# Patient Record
Sex: Female | Born: 1955
Health system: Southern US, Community
[De-identification: ages and names within clinical notes are randomized; demographics above are authoritative.]

## PROBLEM LIST (undated history)

## (undated) ENCOUNTER — Ambulatory Visit

## (undated) DIAGNOSIS — G5601 Carpal tunnel syndrome, right upper limb: Secondary | ICD-10-CM

## (undated) DIAGNOSIS — M1990 Unspecified osteoarthritis, unspecified site: Secondary | ICD-10-CM

## (undated) DIAGNOSIS — M65311 Trigger thumb, right thumb: Secondary | ICD-10-CM

## (undated) DIAGNOSIS — E785 Hyperlipidemia, unspecified: Secondary | ICD-10-CM

## (undated) DIAGNOSIS — Z9889 Other specified postprocedural states: Secondary | ICD-10-CM

## (undated) DIAGNOSIS — R112 Nausea with vomiting, unspecified: Secondary | ICD-10-CM

## (undated) HISTORY — DX: Unspecified osteoarthritis, unspecified site: M19.90

## (undated) HISTORY — PX: MENISCUS REPAIR: SHX5179

## (undated) HISTORY — PX: JOINT REPLACEMENT: SHX530

## (undated) HISTORY — DX: Hyperlipidemia, unspecified: E78.5

## (undated) HISTORY — PX: TUBAL LIGATION: SHX77

---

## 1999-09-26 ENCOUNTER — Encounter: Admission: RE | Admit: 1999-09-26 | Discharge: 1999-09-26 | Payer: Self-pay | Admitting: Internal Medicine

## 1999-09-26 ENCOUNTER — Encounter: Payer: Self-pay | Admitting: Internal Medicine

## 2001-11-18 ENCOUNTER — Encounter: Payer: Self-pay | Admitting: Internal Medicine

## 2001-11-18 ENCOUNTER — Encounter: Admission: RE | Admit: 2001-11-18 | Discharge: 2001-11-18 | Payer: Self-pay | Admitting: Internal Medicine

## 2003-03-30 ENCOUNTER — Encounter: Admission: RE | Admit: 2003-03-30 | Discharge: 2003-03-30 | Payer: Self-pay | Admitting: Internal Medicine

## 2003-03-30 ENCOUNTER — Encounter: Payer: Self-pay | Admitting: Internal Medicine

## 2003-04-24 ENCOUNTER — Emergency Department (HOSPITAL_COMMUNITY): Admission: EM | Admit: 2003-04-24 | Discharge: 2003-04-24 | Payer: Self-pay | Admitting: Emergency Medicine

## 2004-05-07 ENCOUNTER — Encounter: Admission: RE | Admit: 2004-05-07 | Discharge: 2004-05-07 | Payer: Self-pay | Admitting: Internal Medicine

## 2005-02-11 ENCOUNTER — Other Ambulatory Visit: Admission: RE | Admit: 2005-02-11 | Discharge: 2005-02-11 | Payer: Self-pay | Admitting: Internal Medicine

## 2005-06-04 ENCOUNTER — Encounter: Admission: RE | Admit: 2005-06-04 | Discharge: 2005-06-04 | Payer: Self-pay | Admitting: Internal Medicine

## 2006-06-09 ENCOUNTER — Encounter: Admission: RE | Admit: 2006-06-09 | Discharge: 2006-06-09 | Payer: Self-pay | Admitting: Internal Medicine

## 2007-02-23 ENCOUNTER — Other Ambulatory Visit: Admission: RE | Admit: 2007-02-23 | Discharge: 2007-02-23 | Payer: Self-pay | Admitting: Internal Medicine

## 2007-06-13 ENCOUNTER — Encounter: Admission: RE | Admit: 2007-06-13 | Discharge: 2007-06-13 | Payer: Self-pay | Admitting: Internal Medicine

## 2008-04-12 ENCOUNTER — Encounter: Admission: RE | Admit: 2008-04-12 | Discharge: 2008-04-12 | Payer: Self-pay | Admitting: Family Medicine

## 2008-07-18 ENCOUNTER — Encounter: Admission: RE | Admit: 2008-07-18 | Discharge: 2008-07-18 | Payer: Self-pay | Admitting: Internal Medicine

## 2009-03-06 ENCOUNTER — Other Ambulatory Visit: Admission: RE | Admit: 2009-03-06 | Discharge: 2009-03-06 | Payer: Self-pay | Admitting: Internal Medicine

## 2009-08-12 ENCOUNTER — Encounter: Admission: RE | Admit: 2009-08-12 | Discharge: 2009-08-12 | Payer: Self-pay | Admitting: Internal Medicine

## 2010-08-15 ENCOUNTER — Encounter
Admission: RE | Admit: 2010-08-15 | Discharge: 2010-08-15 | Payer: Self-pay | Source: Home / Self Care | Attending: Internal Medicine | Admitting: Internal Medicine

## 2011-03-26 ENCOUNTER — Other Ambulatory Visit (HOSPITAL_COMMUNITY)
Admission: RE | Admit: 2011-03-26 | Discharge: 2011-03-26 | Disposition: A | Discharge status: Home / Self Care | Payer: BC Managed Care – PPO | Source: Ambulatory Visit | Attending: Internal Medicine | Admitting: Internal Medicine

## 2011-03-26 ENCOUNTER — Other Ambulatory Visit: Payer: Self-pay | Admitting: Internal Medicine

## 2011-03-26 DIAGNOSIS — Z01419 Encounter for gynecological examination (general) (routine) without abnormal findings: Secondary | ICD-10-CM | POA: Insufficient documentation

## 2011-08-18 ENCOUNTER — Other Ambulatory Visit: Payer: Self-pay | Admitting: Internal Medicine

## 2011-08-18 DIAGNOSIS — Z1231 Encounter for screening mammogram for malignant neoplasm of breast: Secondary | ICD-10-CM

## 2011-09-10 ENCOUNTER — Ambulatory Visit
Admission: RE | Admit: 2011-09-10 | Discharge: 2011-09-10 | Disposition: A | Discharge status: Home / Self Care | Payer: BC Managed Care – PPO | Source: Ambulatory Visit | Attending: Internal Medicine | Admitting: Internal Medicine

## 2011-09-10 DIAGNOSIS — Z1231 Encounter for screening mammogram for malignant neoplasm of breast: Secondary | ICD-10-CM

## 2012-02-24 ENCOUNTER — Other Ambulatory Visit: Payer: Self-pay | Admitting: Dermatology

## 2012-03-03 ENCOUNTER — Other Ambulatory Visit: Payer: Self-pay | Admitting: Orthopedic Surgery

## 2012-03-11 ENCOUNTER — Encounter (HOSPITAL_BASED_OUTPATIENT_CLINIC_OR_DEPARTMENT_OTHER): Admission: RE | Payer: Self-pay | Source: Ambulatory Visit

## 2012-03-11 ENCOUNTER — Ambulatory Visit (HOSPITAL_BASED_OUTPATIENT_CLINIC_OR_DEPARTMENT_OTHER)
Admission: RE | Admit: 2012-03-11 | Payer: BC Managed Care – PPO | Source: Ambulatory Visit | Admitting: Orthopedic Surgery

## 2012-03-11 SURGERY — EXCISION MASS
Anesthesia: LOCAL | Laterality: Right

## 2012-09-14 ENCOUNTER — Other Ambulatory Visit: Payer: Self-pay | Admitting: Internal Medicine

## 2012-09-14 DIAGNOSIS — Z1231 Encounter for screening mammogram for malignant neoplasm of breast: Secondary | ICD-10-CM

## 2012-10-11 ENCOUNTER — Ambulatory Visit
Admission: RE | Admit: 2012-10-11 | Discharge: 2012-10-11 | Disposition: A | Discharge status: Home / Self Care | Payer: BC Managed Care – PPO | Source: Ambulatory Visit | Attending: Internal Medicine | Admitting: Internal Medicine

## 2012-10-11 DIAGNOSIS — Z1231 Encounter for screening mammogram for malignant neoplasm of breast: Secondary | ICD-10-CM

## 2012-10-21 ENCOUNTER — Other Ambulatory Visit (HOSPITAL_COMMUNITY): Payer: Self-pay | Admitting: Orthopaedic Surgery

## 2012-10-27 NOTE — Patient Instructions (Addendum)
Jordan Wade  3/Jordan/2016   Your procedure is scheduled on:11/04/12  FRIDAY    Report to [facility] at  1030     AM.  Call this number if you have problems the morning of surgery: [phone]       Remember:   Do not eat foodAfter Midnight. Thursday NIGHT,  MAY HAVE CLEAR LIQUIDS Friday MORNING UNTIL 0600 AM, THEN NOTHING BY MOUTH   Take these medicines the morning of surgery with A SIP OF WATER:  NONE   .  Contacts, dentures or partial plates can not be worn to surgery  Leave suitcase in the car. After surgery it may be brought to your room.  For patients admitted to the hospital, checkout time is 11:00 AM day of  discharge.             SPECIAL INSTRUCTIONS- SEE [facility] PREPARING FOR SURGERY INSTRUCTION SHEET-     DO NOT WEAR JEWELRY, LOTIONS, POWDERS, OR PERFUMES.  WOMEN-- DO NOT SHAVE LEGS OR UNDERARMS FOR 12 HOURS BEFORE SHOWERS. MEN MAY SHAVE FACE.  Patients discharged the day of surgery will not be allowed to drive home. IF going home the day of surgery, you must have a driver and someone to stay with you for the first 24 hours  Name and phone number of your driver:     admission                                                                   Please read over the following fact sheets that you were given: MRSA Information, Incentive Spirometry Sheet, Blood Transfusion Sheet  Information                                                                                   Jordan Wade  PST 336  [id number]                 FAILURE TO FOLLOW THESE INSTRUCTIONS MAY RESULT IN  CANCELLATION   OF YOUR SURGERY                                                  Patient Signature _____________________________

## 2012-10-28 ENCOUNTER — Encounter (HOSPITAL_COMMUNITY): Payer: Self-pay

## 2012-10-28 ENCOUNTER — Encounter (HOSPITAL_COMMUNITY)
Admission: RE | Admit: 2012-10-28 | Discharge: 2012-10-28 | Disposition: A | Discharge status: Home / Self Care | Payer: BC Managed Care – PPO | Source: Ambulatory Visit | Attending: Orthopaedic Surgery | Admitting: Orthopaedic Surgery

## 2012-10-28 ENCOUNTER — Encounter (HOSPITAL_COMMUNITY): Payer: Self-pay | Admitting: Pharmacy Technician

## 2012-10-28 DIAGNOSIS — Z01812 Encounter for preprocedural laboratory examination: Secondary | ICD-10-CM | POA: Insufficient documentation

## 2012-10-28 HISTORY — DX: Unspecified osteoarthritis, unspecified site: M19.90

## 2012-10-28 HISTORY — DX: Other specified postprocedural states: R11.2

## 2012-10-28 HISTORY — DX: Other specified postprocedural states: Z98.890

## 2012-10-28 LAB — PROTIME-INR
INR: 0.96 (ref 0.00–1.49)
Prothrombin Time: 12.7 seconds (ref 11.6–15.2)

## 2012-10-28 LAB — URINALYSIS, ROUTINE W REFLEX MICROSCOPIC
Bilirubin Urine: NEGATIVE
Protein, ur: NEGATIVE mg/dL

## 2012-10-28 LAB — APTT: aPTT: 32 seconds (ref 24–37)

## 2012-10-28 LAB — SURGICAL PCR SCREEN
MRSA, PCR: NEGATIVE
Staphylococcus aureus: POSITIVE — AB

## 2012-10-28 LAB — CBC
HCT: 41.9 % (ref 36.0–46.0)
Hemoglobin: 14.2 g/dL (ref 12.0–15.0)
MCH: 31.1 pg (ref 26.0–34.0)
MCHC: 33.9 g/dL (ref 30.0–36.0)
Platelets: 264 10*3/uL (ref 150–400)
RBC: 4.57 MIL/uL (ref 3.87–5.11)
RDW: 13 % (ref 11.5–15.5)
WBC: 8.3 10*3/uL (ref 4.0–10.5)

## 2012-10-28 LAB — ABO/RH: ABO/RH(D): A NEG

## 2012-10-28 LAB — BASIC METABOLIC PANEL: Sodium: 140 mEq/L (ref 135–145)

## 2012-10-28 NOTE — Progress Notes (Signed)
Left message on patients cell phone and at home to begin Mupirocin as directed. Asked for call back verification

## 2012-11-04 ENCOUNTER — Encounter (HOSPITAL_COMMUNITY): Payer: Self-pay | Admitting: Certified Registered Nurse Anesthetist

## 2012-11-04 ENCOUNTER — Inpatient Hospital Stay (HOSPITAL_COMMUNITY): Payer: BC Managed Care – PPO

## 2012-11-04 ENCOUNTER — Encounter (HOSPITAL_COMMUNITY): Payer: Self-pay | Admitting: *Deleted

## 2012-11-04 ENCOUNTER — Inpatient Hospital Stay (HOSPITAL_COMMUNITY)
Admission: RE | Admit: 2012-11-04 | Discharge: 2012-11-07 | DRG: 471 | Disposition: A | Discharge status: Home Health | Payer: BC Managed Care – PPO | Source: Ambulatory Visit | Attending: Orthopaedic Surgery | Admitting: Orthopaedic Surgery

## 2012-11-04 ENCOUNTER — Encounter (HOSPITAL_COMMUNITY)
Admission: RE | Disposition: A | Discharge status: Home Health | Payer: Self-pay | Source: Ambulatory Visit | Attending: Orthopaedic Surgery

## 2012-11-04 ENCOUNTER — Inpatient Hospital Stay (HOSPITAL_COMMUNITY): Payer: BC Managed Care – PPO | Admitting: Certified Registered Nurse Anesthetist

## 2012-11-04 DIAGNOSIS — M1610 Unilateral primary osteoarthritis, unspecified hip: Principal | ICD-10-CM | POA: Diagnosis present

## 2012-11-04 DIAGNOSIS — M169 Osteoarthritis of hip, unspecified: Principal | ICD-10-CM

## 2012-11-04 DIAGNOSIS — Z01812 Encounter for preprocedural laboratory examination: Secondary | ICD-10-CM

## 2012-11-04 HISTORY — PX: BILATERAL ANTERIOR TOTAL HIP ARTHROPLASTY: SHX5567

## 2012-11-04 SURGERY — ARTHROPLASTY, HIP, BILATERAL, TOTAL, ANTERIOR APPROACH
Anesthesia: General | Site: Hip | Laterality: Bilateral | Wound class: Clean

## 2012-11-04 MED ORDER — SUFENTANIL CITRATE 50 MCG/ML IV SOLN
INTRAVENOUS | Status: DC | PRN
  Administered 2012-11-04 (×5): 10 ug via INTRAVENOUS

## 2012-11-04 MED ORDER — OXYCODONE HCL 5 MG PO TABS
5.0000 mg | ORAL_TABLET | ORAL | Status: DC | PRN
  Administered 2012-11-05 (×3): 5 mg via ORAL
  Filled 2012-11-04 (×3): qty 1

## 2012-11-04 MED ORDER — METOCLOPRAMIDE HCL 5 MG/ML IJ SOLN: 5.0000 mg | Freq: Three times a day (TID) | INTRAMUSCULAR | Status: DC | PRN

## 2012-11-04 MED ORDER — SODIUM CHLORIDE 0.9 % IV SOLN
INTRAVENOUS | Status: DC
  Administered 2012-11-04 – 2012-11-05 (×2): via INTRAVENOUS

## 2012-11-04 MED ORDER — METHOCARBAMOL 500 MG PO TABS: 500.0000 mg | ORAL_TABLET | Freq: Four times a day (QID) | ORAL | Status: DC | PRN

## 2012-11-04 MED ORDER — RIVAROXABAN 10 MG PO TABS
10.0000 mg | ORAL_TABLET | Freq: Every day | ORAL | Status: DC
  Administered 2012-11-05 – 2012-11-07 (×3): 10 mg via ORAL
  Filled 2012-11-04 (×4): qty 1

## 2012-11-04 MED ORDER — FERROUS SULFATE 325 (65 FE) MG PO TABS
325.0000 mg | ORAL_TABLET | Freq: Three times a day (TID) | ORAL | Status: DC
  Administered 2012-11-05 – 2012-11-07 (×7): 325 mg via ORAL
  Filled 2012-11-04 (×10): qty 1

## 2012-11-04 MED ORDER — HYDROMORPHONE HCL PF 1 MG/ML IJ SOLN
0.2500 mg | INTRAMUSCULAR | Status: DC | PRN
  Administered 2012-11-04: 0.25 mg via INTRAVENOUS

## 2012-11-04 MED ORDER — DIPHENHYDRAMINE HCL 12.5 MG/5ML PO ELIX: 12.5000 mg | ORAL_SOLUTION | ORAL | Status: DC | PRN

## 2012-11-04 MED ORDER — 0.9 % SODIUM CHLORIDE (POUR BTL) OPTIME
TOPICAL | Status: DC | PRN
  Administered 2012-11-04: 1000 mL

## 2012-11-04 MED ORDER — CEFAZOLIN SODIUM 1-5 GM-% IV SOLN
1.0000 g | Freq: Four times a day (QID) | INTRAVENOUS | Status: AC
  Administered 2012-11-04 – 2012-11-05 (×2): 1 g via INTRAVENOUS
  Filled 2012-11-04 (×2): qty 50

## 2012-11-04 MED ORDER — PROPOFOL 10 MG/ML IV BOLUS
INTRAVENOUS | Status: DC | PRN
  Administered 2012-11-04: 130 mg via INTRAVENOUS

## 2012-11-04 MED ORDER — LACTATED RINGERS IV SOLN: INTRAVENOUS | Status: DC

## 2012-11-04 MED ORDER — LACTATED RINGERS IV SOLN
INTRAVENOUS | Status: DC | PRN
  Administered 2012-11-04 (×4): via INTRAVENOUS

## 2012-11-04 MED ORDER — PHENOL 1.4 % MT LIQD: 1.0000 | OROMUCOSAL | Status: DC | PRN

## 2012-11-04 MED ORDER — DOCUSATE SODIUM 100 MG PO CAPS
100.0000 mg | ORAL_CAPSULE | Freq: Two times a day (BID) | ORAL | Status: DC
  Administered 2012-11-04 – 2012-11-07 (×6): 100 mg via ORAL
  Filled 2012-11-04 (×5): qty 1

## 2012-11-04 MED ORDER — MENTHOL 3 MG MT LOZG
1.0000 | LOZENGE | OROMUCOSAL | Status: DC | PRN
  Filled 2012-11-04: qty 9

## 2012-11-04 MED ORDER — ACETAMINOPHEN 650 MG RE SUPP: 650.0000 mg | Freq: Four times a day (QID) | RECTAL | Status: DC | PRN

## 2012-11-04 MED ORDER — ZOLPIDEM TARTRATE 5 MG PO TABS: 5.0000 mg | ORAL_TABLET | Freq: Every evening | ORAL | Status: DC | PRN

## 2012-11-04 MED ORDER — CEFAZOLIN SODIUM-DEXTROSE 2-3 GM-% IV SOLR
2.0000 g | INTRAVENOUS | Status: AC
  Administered 2012-11-04: 2 g via INTRAVENOUS

## 2012-11-04 MED ORDER — ALUM & MAG HYDROXIDE-SIMETH 200-200-20 MG/5ML PO SUSP: 30.0000 mL | ORAL | Status: DC | PRN

## 2012-11-04 MED ORDER — ACETAMINOPHEN 10 MG/ML IV SOLN
INTRAVENOUS | Status: DC | PRN
  Administered 2012-11-04: 1000 mg via INTRAVENOUS

## 2012-11-04 MED ORDER — HYDROMORPHONE HCL PF 1 MG/ML IJ SOLN
INTRAMUSCULAR | Status: DC | PRN
  Administered 2012-11-04 (×2): .4 mg via INTRAVENOUS

## 2012-11-04 MED ORDER — DEXAMETHASONE SODIUM PHOSPHATE 10 MG/ML IJ SOLN
INTRAMUSCULAR | Status: DC | PRN
  Administered 2012-11-04: 5 mg via INTRAVENOUS

## 2012-11-04 MED ORDER — ONDANSETRON HCL 4 MG/2ML IJ SOLN
INTRAMUSCULAR | Status: DC | PRN
  Administered 2012-11-04: 4 mg via INTRAVENOUS

## 2012-11-04 MED ORDER — METHOCARBAMOL 100 MG/ML IJ SOLN
500.0000 mg | Freq: Four times a day (QID) | INTRAVENOUS | Status: DC | PRN
  Filled 2012-11-04: qty 5

## 2012-11-04 MED ORDER — STERILE WATER FOR IRRIGATION IR SOLN
Status: DC | PRN
  Administered 2012-11-04: 1500 mL

## 2012-11-04 MED ORDER — CISATRACURIUM BESYLATE (PF) 10 MG/5ML IV SOLN
INTRAVENOUS | Status: DC | PRN
  Administered 2012-11-04: 4 mg via INTRAVENOUS
  Administered 2012-11-04: 10 mg via INTRAVENOUS
  Administered 2012-11-04: 4 mg via INTRAVENOUS

## 2012-11-04 MED ORDER — OXYCODONE HCL ER 20 MG PO T12A
20.0000 mg | EXTENDED_RELEASE_TABLET | Freq: Two times a day (BID) | ORAL | Status: DC
  Administered 2012-11-04: 10 mg via ORAL
  Administered 2012-11-05: 20 mg via ORAL
  Administered 2012-11-05: 10 mg via ORAL
  Filled 2012-11-04 (×3): qty 1

## 2012-11-04 MED ORDER — HYDROMORPHONE HCL PF 1 MG/ML IJ SOLN
1.0000 mg | INTRAMUSCULAR | Status: DC | PRN
  Administered 2012-11-04: 0.5 mg via INTRAVENOUS
  Filled 2012-11-04: qty 1

## 2012-11-04 MED ORDER — METOCLOPRAMIDE HCL 5 MG PO TABS
5.0000 mg | ORAL_TABLET | Freq: Three times a day (TID) | ORAL | Status: DC | PRN
  Filled 2012-11-04: qty 2

## 2012-11-04 MED ORDER — ONDANSETRON HCL 4 MG/2ML IJ SOLN: 4.0000 mg | Freq: Four times a day (QID) | INTRAMUSCULAR | Status: DC | PRN

## 2012-11-04 MED ORDER — SODIUM CHLORIDE 0.9 % IR SOLN
Status: DC | PRN
  Administered 2012-11-04 (×2): 1000 mL

## 2012-11-04 MED ORDER — GLYCOPYRROLATE 0.2 MG/ML IJ SOLN
INTRAMUSCULAR | Status: DC | PRN
  Administered 2012-11-04: .4 mg via INTRAVENOUS

## 2012-11-04 MED ORDER — NEOSTIGMINE METHYLSULFATE 1 MG/ML IJ SOLN
INTRAMUSCULAR | Status: DC | PRN
  Administered 2012-11-04: 3 mg via INTRAVENOUS

## 2012-11-04 MED ORDER — ACETAMINOPHEN 325 MG PO TABS: 650.0000 mg | ORAL_TABLET | Freq: Four times a day (QID) | ORAL | Status: DC | PRN

## 2012-11-04 MED ORDER — ONDANSETRON HCL 4 MG PO TABS: 4.0000 mg | ORAL_TABLET | Freq: Four times a day (QID) | ORAL | Status: DC | PRN

## 2012-11-04 MED ORDER — MIDAZOLAM HCL 5 MG/5ML IJ SOLN
INTRAMUSCULAR | Status: DC | PRN
  Administered 2012-11-04: 2 mg via INTRAVENOUS

## 2012-11-04 SURGICAL SUPPLY — 50 items
ADH SKN CLS APL DERMABOND .7 (GAUZE/BANDAGES/DRESSINGS) ×2
BAG SPEC THK2 15X12 ZIP CLS (MISCELLANEOUS) ×2
BAG ZIPLOCK 12X15 (MISCELLANEOUS) ×4 IMPLANT
BLADE SAW SGTL 13.0X1.19X90.0M (BLADE) ×2 IMPLANT
BLADE SAW SGTL 18X1.27X75 (BLADE) ×2 IMPLANT
CELLS DAT CNTRL 66122 CELL SVR (MISCELLANEOUS) ×2 IMPLANT
CLEANER TIP ELECTROSURG 2X2 (MISCELLANEOUS) ×1 IMPLANT
CLOTH BEACON ORANGE TIMEOUT ST (SAFETY) ×4 IMPLANT
DERMABOND ADVANCED (GAUZE/BANDAGES/DRESSINGS) ×2
DERMABOND ADVANCED .7 DNX12 (GAUZE/BANDAGES/DRESSINGS) IMPLANT
DRAPE C-ARM 42X72 X-RAY (DRAPES) ×4 IMPLANT
DRAPE STERI IOBAN 125X83 (DRAPES) ×4 IMPLANT
DRAPE U-SHAPE 47X51 STRL (DRAPES) ×12 IMPLANT
DRSG AQUACEL AG ADV 3.5X10 (GAUZE/BANDAGES/DRESSINGS) ×2 IMPLANT
DRSG MEPILEX BORDER 4X8 (GAUZE/BANDAGES/DRESSINGS) ×2 IMPLANT
DRSG XEROFORM 1X8 (GAUZE/BANDAGES/DRESSINGS) ×1 IMPLANT
DURAPREP 26ML APPLICATOR (WOUND CARE) ×4 IMPLANT
ELECT BLADE TIP CTD 4 INCH (ELECTRODE) ×4 IMPLANT
ELECT REM PT RETURN 9FT ADLT (ELECTROSURGICAL) ×4
ELECTRODE REM PT RTRN 9FT ADLT (ELECTROSURGICAL) ×2 IMPLANT
FACESHIELD LNG OPTICON STERILE (SAFETY) ×10 IMPLANT
GLOVE BIO SURGEON STRL SZ7.5 (GLOVE) ×6 IMPLANT
GLOVE BIOGEL PI IND STRL 7.5 (GLOVE) ×2 IMPLANT
GLOVE BIOGEL PI IND STRL 8 (GLOVE) ×2 IMPLANT
GLOVE BIOGEL PI INDICATOR 7.5 (GLOVE)
GLOVE BIOGEL PI INDICATOR 8 (GLOVE) ×4
GOWN STRL REIN XL XLG (GOWN DISPOSABLE) ×10 IMPLANT
KIT BASIN OR (CUSTOM PROCEDURE TRAY) ×4 IMPLANT
PACK TOTAL JOINT (CUSTOM PROCEDURE TRAY) ×2 IMPLANT
PADDING CAST COTTON 6X4 STRL (CAST SUPPLIES) ×2 IMPLANT
PENCIL BUTTON HOLSTER BLD 10FT (ELECTRODE) ×2 IMPLANT
RETRACTOR WND ALEXIS 18 MED (MISCELLANEOUS) IMPLANT
RTRCTR WOUND ALEXIS 18CM MED (MISCELLANEOUS) ×4
SCREW PINN CAN 6.5X20 (Screw) ×1 IMPLANT
SPONGE LAP 18X18 X RAY DECT (DISPOSABLE) ×1 IMPLANT
STAPLER SKIN PROX WIDE 3.9 (STAPLE) ×2 IMPLANT
SUCTION FRAZIER 12FR DISP (SUCTIONS) ×1 IMPLANT
SUT ETHIBOND NAB CT1 #1 30IN (SUTURE) ×3 IMPLANT
SUT ETHILON 3 0 PS 1 (SUTURE) ×1 IMPLANT
SUT MNCRL AB 4-0 PS2 18 (SUTURE) ×2 IMPLANT
SUT VIC AB 0 CT1 27 (SUTURE) ×4
SUT VIC AB 0 CT1 27XBRD ANTBC (SUTURE) IMPLANT
SUT VIC AB 1 CT1 27 (SUTURE) ×4
SUT VIC AB 1 CT1 27XBRD ANTBC (SUTURE) IMPLANT
SUT VIC AB 2-0 CT1 27 (SUTURE) ×4
SUT VIC AB 2-0 CT1 TAPERPNT 27 (SUTURE) ×2 IMPLANT
SUT VLOC 180 0 24IN GS25 (SUTURE) ×1 IMPLANT
TOWEL OR 17X26 10 PK STRL BLUE (TOWEL DISPOSABLE) ×4 IMPLANT
TRAY FOLEY CATH 14FRSI W/METER (CATHETERS) ×2 IMPLANT
YANKAUER SUCT BULB TIP 10FT TU (MISCELLANEOUS) ×4 IMPLANT

## 2012-11-04 NOTE — Anesthesia Postprocedure Evaluation (Signed)
  Anesthesia Post-op Note  Patient: Jordan Wade  Procedure(s) Performed: Procedure(s) (LRB): BILATERAL ANTERIOR TOTAL HIP ARTHROPLASTY (Bilateral)  Patient Location: PACU  Anesthesia Type: General  Level of Consciousness: awake and alert   Airway and Oxygen Therapy: Patient Spontanous Breathing  Post-op Pain: mild  Post-op Assessment: Post-op Vital signs reviewed, Patient's Cardiovascular Status Stable, Respiratory Function Stable, Patent Airway and No signs of Nausea or vomiting  Last Vitals:  Filed Vitals:   11/04/12 1645  BP: 123/61  Pulse: 73  Temp:   Resp: 8    Post-op Vital Signs: stable   Complications: No apparent anesthesia complications

## 2012-11-04 NOTE — Preoperative (Signed)
Beta Blockers   Reason not to administer Beta Blockers:Not Applicable 

## 2012-11-04 NOTE — H&P (Signed)
I have seen Ms. Hart Rochester and agree with the H&P note.

## 2012-11-04 NOTE — Brief Op Note (Signed)
11/04/2012  4:04 PM  PATIENT:  Jordan Wade  57 y.o. female  PRE-OPERATIVE DIAGNOSIS:  Severe osteoarthritis bilateral hips  POST-OPERATIVE DIAGNOSIS:  Severe osteoarthritis bilateral hips  PROCEDURE:  Procedure(s): BILATERAL ANTERIOR TOTAL HIP ARTHROPLASTY (Bilateral)  SURGEON:  Surgeon(s) and Role:    * Kathryne Hitch, MD - Primary  PHYSICIAN ASSISTANT: Rexene Edison, PA-C  ANESTHESIA:   general  EBL:  Total I/O In: 3000 [I.V.:3000] Out: 925 [Urine:325; Blood:600]  BLOOD ADMINISTERED:none  DRAINS: none   LOCAL MEDICATIONS USED:  NONE  SPECIMEN:  No Specimen  DISPOSITION OF SPECIMEN:  N/A  COUNTS:  YES  TOURNIQUET:  * No tourniquets in log *  DICTATION: .Other Dictation: Dictation Number [id number]  PLAN OF CARE: Admit to inpatient   PATIENT DISPOSITION:  PACU - hemodynamically stable.   Delay start of Pharmacological VTE agent (>24hrs) due to surgical blood loss or risk of bleeding: no

## 2012-11-04 NOTE — Anesthesia Preprocedure Evaluation (Addendum)
Anesthesia Evaluation  Patient identified by MRN, date of birth, ID band Patient awake    Reviewed: Allergy & Precautions, H&P , NPO status , Patient's Chart, lab work & pertinent test results  History of Anesthesia Complications (+) PONV  Airway Mallampati: II TM Distance: >3 FB Neck ROM: full    Dental no notable dental hx. (+) Teeth Intact and Dental Advisory Given   Pulmonary neg pulmonary ROS,  breath sounds clear to auscultation  Pulmonary exam normal       Cardiovascular Exercise Tolerance: Good negative cardio ROS  Rhythm:regular Rate:Normal     Neuro/Psych negative neurological ROS  negative psych ROS   GI/Hepatic negative GI ROS, Neg liver ROS,   Endo/Other  negative endocrine ROS  Renal/GU negative Renal ROS  negative genitourinary   Musculoskeletal   Abdominal   Peds  Hematology negative hematology ROS (+)   Anesthesia Other Findings   Reproductive/Obstetrics negative OB ROS                          Anesthesia Physical Anesthesia Plan  ASA: I  Anesthesia Plan: General   Post-op Pain Management:    Induction: Intravenous  Airway Management Planned: Oral ETT  Additional Equipment:   Intra-op Plan:   Post-operative Plan: Extubation in OR  Informed Consent: I have reviewed the patients History and Physical, chart, labs and discussed the procedure including the risks, benefits and alternatives for the proposed anesthesia with the patient or authorized representative who has indicated his/her understanding and acceptance.   Dental Advisory Given  Plan Discussed with: CRNA and Surgeon  Anesthesia Plan Comments:         Anesthesia Quick Evaluation

## 2012-11-04 NOTE — Transfer of Care (Signed)
Immediate Anesthesia Transfer of Care Note  Patient: Jordan Wade  Procedure(s) Performed: Procedure(s): BILATERAL ANTERIOR TOTAL HIP ARTHROPLASTY (Bilateral)  Patient Location: PACU  Anesthesia Type:General  Level of Consciousness: awake, alert  and oriented  Airway & Oxygen Therapy: Patient Spontanous Breathing and Patient connected to face mask oxygen  Post-op Assessment: Report given to PACU RN and Post -op Vital signs reviewed and stable  Post vital signs: Reviewed and stable  Complications: No apparent anesthesia complications

## 2012-11-04 NOTE — Plan of Care (Signed)
Problem: Consults Goal: Diagnosis- Total Joint Replacement Bilateral anterior total hips

## 2012-11-04 NOTE — H&P (Signed)
TOTAL HIP ADMISSION H&P  Patient is admitted for bilaterally total hip arthroplasty.  Subjective:  Chief Complaint: Bilateral  hip pain  HPI: Jordan Wade, 57 y.o. female, has a history of pain and functional disability in the bilaterally hip(s) due to arthritis and patient has failed non-surgical conservative treatments for greater than 12 weeks to include NSAID's and/or analgesics, also corticosteroid injection and activity modification.  Onset of symptoms was gradual starting 4 years ago with gradually worsening course since that time.The patient noted no past surgery on the bilaterally hip(s).  Patient currently rates pain in the bilaterally hip at 10 out of 10 with activity. Patient has night pain, worsening of pain with activity and weight bearing, pain that interfers with activities of daily living, pain with passive range of motion and crepitus. Patient has evidence of subchondral cysts, subchondral sclerosis, periarticular osteophytes and joint space narrowing by imaging studies. This condition presents safety issues increasing the risk of falls.  There is no current active infection.  Patient Active Problem List   Diagnosis Date Noted  . Degenerative arthritis of hip 11/04/2012   Past Medical History  Diagnosis Date  . PONV (postoperative nausea and vomiting)   . Arthritis     Past Surgical History  Procedure Laterality Date  . Cesarean section      x 2    No prescriptions prior to admission   No Known Allergies  History  Substance Use Topics  . Smoking status: Never Smoker   . Smokeless tobacco: Never Used  . Alcohol Use: No    No family history on file.   Review of Systems  Musculoskeletal:       Bilateral hip pain  All other systems reviewed and are negative.    Objective:  Physical Exam  Constitutional: She is oriented to person, place, and time. She appears well-developed and well-nourished.  HENT:  Head: Normocephalic and atraumatic.  Eyes: EOM are  normal.  Cardiovascular: Normal rate and regular rhythm.   Respiratory: Effort normal and breath sounds normal. She has no wheezes. She has no rales.  Musculoskeletal:  Decreased ROM bilateral hips. Significant pain bilateral hips with ROM. Bilateral knees non tender GROM.  Dorsiflexion / plantar flexion intact bilateral calves supple non tender  Neurological: She is alert and oriented to person, place, and time.  Sensation intact bilateral feet to light touch  Skin: Skin is warm and dry.  Psychiatric: She has a normal mood and affect. Her behavior is normal. Thought content normal.    Vital signs in last 24 hours:    Labs:   There is no height or weight on file to calculate BMI.   Imaging Review Plain radiographs demonstrate severe degenerative joint disease of the bilateral hip(s). The bone quality appears to be excellent for age and reported activity level.  Assessment/Plan:  End stage arthritis, bilaterally hip(s)  The patient history, physical examination, clinical judgement of the provider and imaging studies are consistent with end stage degenerative joint disease of the bilaterally hip(s) and total hip arthroplasty is deemed medically necessary. The treatment options including medical management, injection therapy, arthroscopy and arthroplasty were discussed at length. The risks and benefits of total hip arthroplasty were presented and reviewed. The risks due to aseptic loosening, infection, stiffness, dislocation/subluxation,  thromboembolic complications and other imponderables were discussed.  The patient acknowledged the explanation, agreed to proceed with the plan and consent was signed. Patient is being admitted for inpatient treatment for surgery, pain control, PT, OT, prophylactic  antibiotics, VTE prophylaxis, progressive ambulation and ADL's and discharge planning.The patient is planning to be discharged home with home health services

## 2012-11-05 LAB — CBC
HCT: 27.3 % — ABNORMAL LOW (ref 36.0–46.0)
MCH: 30.7 pg (ref 26.0–34.0)
MCHC: 33.7 g/dL (ref 30.0–36.0)
Platelets: 207 10*3/uL (ref 150–400)
RDW: 12.5 % (ref 11.5–15.5)

## 2012-11-05 LAB — BASIC METABOLIC PANEL
CO2: 28 mEq/L (ref 19–32)
Chloride: 99 mEq/L (ref 96–112)
Glucose, Bld: 133 mg/dL — ABNORMAL HIGH (ref 70–99)
Sodium: 133 mEq/L — ABNORMAL LOW (ref 135–145)

## 2012-11-05 NOTE — Progress Notes (Signed)
Subjective: 1 Day Post-Op Procedure(s) (LRB): BILATERAL ANTERIOR TOTAL HIP ARTHROPLASTY (Bilateral) Patient reports pain as moderate.    Objective: Vital signs in last 24 hours: Temp:  [97 F (36.1 C)-98.9 F (37.2 C)] 98.9 F (37.2 C) (03/29 0949) Pulse Rate:  [71-89] 73 (03/29 0949) Resp:  [8-16] 16 (03/29 0949) BP: (108-134)/(54-80) 108/70 mmHg (03/29 0949) SpO2:  [96 %-100 %] 100 % (03/29 0949) Weight:  [61.689 kg (136 lb)] 61.689 kg (136 lb) (03/28 1800)  Intake/Output from previous day: 03/28 0701 - 03/29 0700 In: 4501.3 [P.O.:240; I.V.:4261.3] Out: 1375 [Urine:775; Blood:600] Intake/Output this shift: Total I/O In: 380 [P.O.:380] Out: 1200 [Urine:1200]   Recent Labs  11/05/12 0515  HGB 9.2*    Recent Labs  11/05/12 0515  WBC 12.1*  RBC 3.00*  HCT 27.3*  PLT 207    Recent Labs  11/05/12 0515  NA 133*  K 4.0  CL 99  CO2 28  BUN 13  CREATININE 0.64  GLUCOSE 133*  CALCIUM 8.1*   No results found for this basename: LABPT, INR,  in the last 72 hours  Neurologically intact  Assessment/Plan: 1 Day Post-Op Procedure(s) (LRB): BILATERAL ANTERIOR TOTAL HIP ARTHROPLASTY (Bilateral) Up with therapy ambulated to hall. xrays look good.   Latayna Ritchie C 11/05/2012, 11:15 AM

## 2012-11-05 NOTE — Care Management Note (Addendum)
Cm spoke with patient concerning discharge planning. Per pt choice [facility] to provide [facility] services upon discharge. Genevieve Norlander weekend rep Pippins Fiscal made aware, spoke with patient at bedside concerning [facility] agency services. Pt states spouse to assist in home care. Pt states having DME needs for home use. No other needs stated. Awaiting MD orders.   Roxy Manns Essynce Munsch,RN,BSN [phone]

## 2012-11-05 NOTE — Evaluation (Signed)
Occupational Therapy Evaluation Patient Details Name: Jordan Wade MRN: [id number] DOB: Nov 27, 1955 Today's Date: 11/05/2012 Time: 1324-1400 OT Time Calculation (min): 36 min  OT Assessment / Plan / Recommendation Clinical Impression  Pt is recovering from bilateral anterior hip replacments.  Pt is doing well POD 1 with mobility and pain is well managed.  Pt and husband instructed in safety, use of DME and AE for ADL.  No further OT needs.    OT Assessment  Patient does not need any further OT services    Follow Up Recommendations  No OT follow up;Supervision/Assistance - 24 hour    Barriers to Discharge      Equipment Recommendations  None recommended by OT    Recommendations for Other Services    Frequency       Precautions / Restrictions Precautions Precautions: Fall Restrictions Weight Bearing Restrictions: No Other Position/Activity Restrictions: WBAT   Pertinent Vitals/Pain Soreness, bilat hips L >R, premedicated, repositioned    ADL  Eating/Feeding: Independent Where Assessed - Eating/Feeding: Chair Grooming: Supervision/safety Where Assessed - Grooming: Unsupported standing Upper Body Bathing: Set up Where Assessed - Upper Body Bathing: Unsupported sitting Lower Body Bathing: Maximal assistance Where Assessed - Lower Body Bathing: Unsupported sitting;Supported sit to stand Upper Body Dressing: Set up Where Assessed - Upper Body Dressing: Unsupported sitting Lower Body Dressing: Maximal assistance Where Assessed - Lower Body Dressing: Unsupported sitting;Supported sit to stand Toilet Transfer: Min Pension scheme manager Method: Sit to stand Equipment Used: Long-handled shoe horn;Long-handled sponge;Reacher;Rolling walker;Sock aid Transfers/Ambulation Related to ADLs: min to min guard assist with RW and verbal cues for hand placement and technique ADL Comments: Instructed in use of AE for LB ADL and in tub transfer.  Pt has resin chairs she may use for tub  seating as needed.  Husband will supervise showering.  Educated in optimal footwear to decrease fall risk.    OT Diagnosis:    OT Problem List:   OT Treatment Interventions:     OT Goals    Visit Information  Last OT Received On: 11/05/12 Assistance Needed: +1 PT/OT Co-Evaluation/Treatment: Yes    Subjective Data  Subjective: "I feel much better than this morning." Patient Stated Goal: Home with husband, return to work as a Runner, broadcasting/film/video.   Prior Functioning     Home Living Lives With: Spouse Available Help at Discharge: Family;Available 24 hours/day Type of Home: House Home Access: Stairs to enter [employer] of Steps: 5 Entrance Stairs-Rails: Right Home Layout: One level Bathroom Shower/Tub: Engineer, manufacturing systems: Standard Home Adaptive Equipment: Walker - rolling;Straight cane;Sock aid;Reacher;Raised toilet seat with rails Prior Function Level of Independence: Independent;Independent with assistive device(s) Able to Take Stairs?: Yes Driving: Yes Vocation: Full time employment Communication Communication: No difficulties Dominant Hand: Right         Vision/Perception Vision - History Baseline Vision: Wears glasses all the time   Cognition  Cognition Overall Cognitive Status: Appears within functional limits for tasks assessed/performed Arousal/Alertness: Awake/alert Orientation Level: Appears intact for tasks assessed Behavior During Session: [facility] for tasks performed    Extremity/Trunk Assessment Right Upper Extremity Assessment RUE ROM/Strength/Tone: WFL for tasks assessed RUE Coordination: WFL - gross/fine motor Left Upper Extremity Assessment LUE ROM/Strength/Tone: WFL for tasks assessed LUE Coordination: WFL - gross/fine motor     Mobility Bed Mobility Bed Mobility: Sit to Supine Supine to Sit: 3: Mod assist Sit to Supine: 3: Mod assist Details for Bed Mobility Assistance: cues for sequence, assist to manage Bil  LEs Transfers Transfers:  Sit to Stand;Stand to Sit Sit to Stand: 3: Mod assist;4: Min assist Stand to Sit: 3: Mod assist;4: Min assist Details for Transfer Assistance: cues for use of UEs and for LE management.       Exercise     Balance     End of Session OT - End of Session Activity Tolerance: Patient tolerated treatment well Patient left: in bed;with call bell/phone within reach;with family/visitor present  GO     Evern Bio 11/05/2012, 2:26 PM [phone]

## 2012-11-05 NOTE — Progress Notes (Signed)
Physical Therapy Treatment Patient Details Name: Jordan Wade MRN: [id number] DOB: 04/04/1956 Today's Date: 11/05/2012 Time: 1325-1350 PT Time Calculation (min): 25 min  PT Assessment / Plan / Recommendation Comments on Treatment Session       Follow Up Recommendations  Home health PT     Does the patient have the potential to tolerate intense rehabilitation     Barriers to Discharge        Equipment Recommendations  None recommended by PT    Recommendations for Other Services OT consult  Frequency 7X/week   Plan Discharge plan remains appropriate    Precautions / Restrictions Precautions Precautions: Fall Restrictions Other Position/Activity Restrictions: WBAT   Pertinent Vitals/Pain 5/10; Premed, ice packs provided.    Mobility  Bed Mobility Bed Mobility: Sit to Supine Sit to Supine: 3: Mod assist Details for Bed Mobility Assistance: cues for sequence, assist to manage Bil LEs Transfers Transfers: Sit to Stand;Stand to Sit Sit to Stand: 3: Mod assist;4: Min assist Stand to Sit: 3: Mod assist;4: Min assist Details for Transfer Assistance: cues for use of UEs and for LE management.   Ambulation/Gait Ambulation/Gait Assistance: 4: Min assist;3: Mod assist Ambulation Distance (Feet): 64 Feet Assistive device: Rolling walker Ambulation/Gait Assistance Details: cues for posture, sequence, stride length and position from RW Gait Pattern: Step-to pattern;Decreased step length - right;Decreased step length - left Stairs: No    Exercises     PT Diagnosis:    PT Problem List:   PT Treatment Interventions:     PT Goals Acute Rehab PT Goals PT Goal Formulation: With patient Time For Goal Achievement: 11/10/12 Potential to Achieve Goals: Good Pt will go Supine/Side to Sit: with supervision PT Goal: Supine/Side to Sit - Progress: Goal set today Pt will go Sit to Supine/Side: with supervision PT Goal: Sit to Supine/Side - Progress: Goal set today Pt will go Sit  to Stand: with supervision PT Goal: Sit to Stand - Progress: Goal set today Pt will go Stand to Sit: with supervision PT Goal: Stand to Sit - Progress: Goal set today Pt will Ambulate: 51 - 150 feet;with supervision;with rolling walker PT Goal: Ambulate - Progress: Progressing toward goal Pt will Go Up / Down Stairs: 3-5 stairs;with min assist;with least restrictive assistive device PT Goal: Up/Down Stairs - Progress: Goal set today  Visit Information  Last PT Received On: 11/05/12 Assistance Needed: +1 PT/OT Co-Evaluation/Treatment: Yes    Subjective Data  Subjective: I'm doing even better than this morning Patient Stated Goal: Resume previous lifestyle with decreased pain   Cognition  Cognition Overall Cognitive Status: Appears within functional limits for tasks assessed/performed Arousal/Alertness: Awake/alert Orientation Level: Appears intact for tasks assessed Behavior During Session: [facility] for tasks performed    Balance     End of Session PT - End of Session Activity Tolerance: Patient tolerated treatment well Patient left: in bed;with call bell/phone within reach;with family/visitor present Nurse Communication: Mobility status   GP     Jordan Wade 11/05/2012, 2:01 PM

## 2012-11-05 NOTE — Progress Notes (Signed)
Physical Therapy Evaluation Patient Details Name: Jordan Wade MRN: [id number] DOB: 02-15-1956 Today's Date: 11/05/2012 Time: 7846-9629 PT Time Calculation (min): 39 min  PT Assessment / Plan / Recommendation Clinical Impression  Pt s/p BIl THR presents with decreased Bil LE strength/ROM and post op pain limiting functional mobility.  Pt is very motivated and with excellent home support and plans d/c to home directly from hospital    PT Assessment  Patient needs continued PT services    Follow Up Recommendations  Home health PT    Does the patient have the potential to tolerate intense rehabilitation      Barriers to Discharge None      Equipment Recommendations  None recommended by PT    Recommendations for Other Services OT consult   Frequency 7X/week    Precautions / Restrictions Precautions Precautions: Fall Restrictions Weight Bearing Restrictions: No Other Position/Activity Restrictions: WBAT   Pertinent Vitals/Pain 6/10; premed, ice packs provided      Mobility  Bed Mobility Bed Mobility: Supine to Sit Supine to Sit: 3: Mod assist Details for Bed Mobility Assistance: cues for sequence, assist to manage Bil LEs Transfers Transfers: Sit to Stand;Stand to Sit Sit to Stand: 1: +2 Total assist;With upper extremity assist;From bed;From elevated surface Sit to Stand: Patient Percentage: 70% Stand to Sit: 1: +2 Total assist;To chair/3-in-1;With upper extremity assist;With armrests Stand to Sit: Patient Percentage: 70% Details for Transfer Assistance: cues for use of UEs and for LE management.   Ambulation/Gait Ambulation/Gait Assistance: 1: +2 Total assist Ambulation/Gait: Patient Percentage: 70% Ambulation Distance (Feet): 18 Feet Assistive device: Rolling walker Ambulation/Gait Assistance Details: cues for posture, sequence and position from RW Gait Pattern: Step-to pattern;Decreased step length - right;Decreased step length - left Stairs: No     Exercises Total Joint Exercises Ankle Circles/Pumps: AROM;10 reps;Supine;Both Quad Sets: AROM;Both;10 reps;Supine Heel Slides: AAROM;Both;Supine;15 reps Hip ABduction/ADduction: AAROM;Both;10 reps;Supine   PT Diagnosis: Difficulty walking  PT Problem List: Decreased strength;Decreased range of motion;Decreased activity tolerance;Decreased mobility;Decreased knowledge of use of DME;Pain PT Treatment Interventions: DME instruction;Gait training;Stair training;Functional mobility training;Therapeutic activities;Therapeutic exercise;Patient/family education   PT Goals Acute Rehab PT Goals PT Goal Formulation: With patient Time For Goal Achievement: 11/10/12 Potential to Achieve Goals: Good Pt will go Supine/Side to Sit: with supervision PT Goal: Supine/Side to Sit - Progress: Goal set today Pt will go Sit to Supine/Side: with supervision PT Goal: Sit to Supine/Side - Progress: Goal set today Pt will go Sit to Stand: with supervision PT Goal: Sit to Stand - Progress: Goal set today Pt will go Stand to Sit: with supervision PT Goal: Stand to Sit - Progress: Goal set today Pt will Ambulate: 51 - 150 feet;with supervision;with rolling walker PT Goal: Ambulate - Progress: Goal set today Pt will Go Up / Down Stairs: 3-5 stairs;with min assist;with least restrictive assistive device PT Goal: Up/Down Stairs - Progress: Goal set today  Visit Information  Last PT Received On: 11/05/12 Assistance Needed: +2    Subjective Data  Subjective: Dr Magnus Ivan was able to move my surgery up so this happened pretty fast Patient Stated Goal: Resume previous lifestyle with decreased pain   Prior Functioning  Home Living Lives With: Spouse Available Help at Discharge: Family Type of Home: House Home Access: Stairs to enter Secretary/administrator of Steps: 5 Entrance Stairs-Rails: Right Home Layout: One level Home Adaptive Equipment: Walker - rolling;Straight cane Prior Function Level of  Independence: Independent;Independent with assistive device(s) Able to Take Stairs?: Yes Driving: Yes Vocation:  Full time employment Communication Communication: No difficulties Dominant Hand: Right    Cognition  Cognition Overall Cognitive Status: Appears within functional limits for tasks assessed/performed Arousal/Alertness: Awake/alert Orientation Level: Appears intact for tasks assessed Behavior During Session: [facility] for tasks performed    Extremity/Trunk Assessment Right Upper Extremity Assessment RUE ROM/Strength/Tone: [facility] for tasks assessed Left Upper Extremity Assessment LUE ROM/Strength/Tone: WFL for tasks assessed Right Lower Extremity Assessment RLE ROM/Strength/Tone: Deficits RLE ROM/Strength/Tone Deficits: hip strength 2+/5 with AAROM to 75 flex and 30 abd Left Lower Extremity Assessment LLE ROM/Strength/Tone: Deficits LLE ROM/Strength/Tone Deficits: hip strength 2+/5 with AAROM hip flex to 75 and abd to 30   Balance    End of Session PT - End of Session Equipment Utilized During Treatment: Gait belt Activity Tolerance: Patient tolerated treatment well Patient left: in chair;with call bell/phone within reach;with family/visitor present Nurse Communication: Mobility status  GP     Precilla Purnell 11/05/2012, 12:03 PM

## 2012-11-05 NOTE — Op Note (Signed)
NAMESALOMA, Jordan Wade               ACCOUNT NO.:  [id number]  MEDICAL RECORD NO.:  [id number]  LOCATION:  1610                         FACILITY:  [facility]  PHYSICIAN:  Vanita Panda. Magnus Ivan, M.D.DATE OF BIRTH:  [birth year]  DATE OF PROCEDURE:  11/04/2012 DATE OF DISCHARGE:                              OPERATIVE REPORT   PREOPERATIVE DIAGNOSIS:  Severe end-stage arthritis, bilateral hips.  POSTOPERATIVE DIAGNOSIS:  Severe end-stage arthritis, bilateral hips.  PROCEDURES:  Bilateral total hip arthroplasties.  IMPLANTS:  Both hips the same sizes which are size 50 DePuy Sector Gription acetabular component, with a left-sided having 2 screws and the right side no screws, both with a 32 +4 neutral polyethylene liner, both with size 10 Corail femoral component with standard offset (KA), both with 32 +1 ceramic hip ball.  SURGEON:  Vanita Panda. Magnus Ivan, M.D..  ASSISTING:  Richardean Canal, PA-C.  ANESTHESIA:  General.  ANTIBIOTICS:  2 g IV Ancef.  ESTIMATED BLOOD LOSS:  600 mL total.  COMPLICATIONS:  None.  PREOP INDICATIONS:  Jordan Wade is a very pleasant 57 year old female with bilateral hip arthritis.  Both of them were quite severe and the pain has been debilitating.  X-rays on the left were much worse than the right, but the right does show loss of joint space, and she has had recurrent effusions of that hip with synovitis of the right hip.  The left hip shows complete loss of joint space, subchondral cystic changes with subchondral sclerosis.  With both hips being severely bad, she wished to go ahead and proceed with a total hip arthroplasty on both sides.  I let her know that the left side to me seemed to be worse and that also felt worse to her that if they could proceed with the left side first and if she was tolerating this well, we go to the right hip next.  She understands this fully.  She is a very healthy 57 year old female with no medical problems at  all.  PROCEDURE DESCRIPTION:  After informed consent was obtained, appropriate left and right hips were marked.  She was brought to the operating room and general anesthesia was obtained.  Traction boots were then placed on both of her feet.  She has a Foley catheter was placed and she was placed supine on the Hana fracture table.  We then brought in the fluoroscopic unit to assess her center of the pelvis and both hips for judging leg lengths.  We then backed up with C-arm and prepped the left hip first with DuraPrep and sterile drapes.  A time-out was called to identify correct patient and correct left hip first.  We then made an incision just inferior and posterior to the anterosuperior iliac spine and carried this obliquely down the leg.  I dissected down to the tensor fascia lata and the tensor fascia was divided longitudinally.  I then proceeded with direct anterior approach to the hip.  A Cobra retractor was placed around the lateral neck and then up underneath the rectus femoris, a medial retractor was placed.  I cauterized the lateral femoral circumflex vessels and then opened up the hip capsule in a L- type format.  I  found a large effusion, I put the Cobra retractors within hip capsule and then made my femoral neck cut proximal to the lesser trochanter.  Once this was done, I completed this.  I made that neck cut with an oscillating saw and then completed with an osteotome. I placed a corkscrew guide in the femoral head and removed the femoral head in its entirety and found it to be completely devoid of cartilage. I then cleaned the acetabular debris and placed a bent Hohmann medially and a Cobra retractor laterally.  I found significant sclerotic bone in the left side.  I then began reaming in 2 mm increments from size 42 all the way up to a size 50 with all reamers placed under direct visualization.  The last reamer also was placed under direct fluoroscopy.  Once I was  pleased with my depth of ring of my inclination and anteversion, I placed a real DePuy Sector Gription acetabular component size 50, through the sclerotic bone I did place 2 screws as well.  I then placed the real neutral 32 +4 polyethylene liner. Attention was then turned to the femur with the leg externally rotated to 90 degrees, extended and adducted.  I gained access to the femoral canal.  I then placed a Muller retractor medially and a Hohmann retractor behind the greater trochanter.  I released the lateral capsule and then brought the hip up further.  I then used a box cutting guide in the femoral canal and a rongeur to lateralize.  I then began broaching from a size 8 broach up to a size 10.  Size 10 felt like it filled the canal.  I used a calcar planer to plane and then placed a standard neck and a 32+ 1 hip ball.  We brought the leg back up and over with traction, internal rotation reduced into the pelvis.  It was stable on internal and external rotation.  Had minimal shuck and we measured the leg lengths so they were exactly equal.  We then dislocated this hip with the trial components.  We removed the trial components and we placed a real then size 10 Corail femoral component with standard offset and the real 32 +1 ceramic hip ball.  We reduced this back in the acetabulum.  The needle was stable.  We used pulsatile lavage to irrigate the soft tissues.  We then closed the joint capsule in interrupted #1 Ethibond suture followed by running #1 Vicryl in the tensor fascia, 2-0 Vicryl in the deep tissue, 2-0 Vicryl subcutaneous layer and then a 4-0 Monocryl subcuticular stitch.  Dermabond was also placed in a well-padded sterile dressing.  We talked to anesthesia, she says she was doing very well with only 300 mL of blood loss, I felt we could proceed to the right hip.  We kept the back table stable and then we brought the C-arm to the other side.  We judged her leg lengths  again on that side.  Then prepped the right hip with DuraPrep and sterile drapes.  Time-out was called to identify correct patient, correct right hip, I then made an incision inferior and posterior to the anterosuperior iliac spine and carried this obliquely down the leg. Electrocautery was used to cauterize the vessels.  We then dissected down to the tensor fascia lata.  The tensor fascia was divided longitudinally.  I then proceeded with a direct anterior approach to the right hip.  Cobra retractor was placed around the lateral neck of the right  side and then Cobra retractors was placed on the medial neck.  We cauterized the lateral femoral circumflex vessels and I then opened up the hip capsule in a L-type format.  There was a large effusion on this site and significant synovitis.  I then placed Cobra retractors within the arthrotomy on the hip joint on the right side.  I then used oscillating saw to make my femoral neck cut just proximal to the lesser trochanter and completed this with an osteotome.  We then removed the femoral head in its entirety and found it be devoid and had significant cartilage loss.  I then placed a bent Hohmann medially and a Cobra retractor laterally to retain remnants of the acetabular labrum from here and then began reaming from size 42 up to a size 50 reamer and 2 mm increments with all reamers placed under direct visualization and the last reamer placed under direct fluoroscopy and again I gained my depth of reaming and inclination and anteversion.  Once that was completed, we then placed the real DePuy Sector Gription acetabular component size 50, this one had more solid fit from the opposite side, so I did not feel the necessity to place any screws.  I did try to place 1 screw, but I was not successful, so I kept out.  I then placed the real 32 +4 neutral polyethylene liner.  Attention was turned to the femur with the leg externally rotated to 90  degrees, extended and adducted.  I used a bent Hohmann medially and a Cobra retractor behind the greater trochanter.  I used a box cutting guide to open the femoral canal and a rongeur to lateralize.  I began broaching from a size 8 broach to a size 10 broach, and the 10 broach was again felt to be stable, so we trialed a standard neck and a 32 +1 hip ball.  We brought the leg back up and over and with traction and internal rotation we were able to reduce the pelvis and it was stable and the leg lengths were measured to be equal under direct fluoroscopy.  We then dislocated the hip again.  I placed the real Corail femoral component size 10 with standard offset by real 32 +1 hip ball.  Again brought the leg back up and reduced this in the acetabulum and again it was stable.  Leg lengths were measured to be equal.  We then irrigated this out with normal saline solution using pulsatile lavage.  The joint capsule with interrupted #1 Ethibond suture followed by #1 Vicryl in the tensor fascia, 0 Vicryl in the deep tissue, 2-0 Vicryl in subcutaneous tissue, and running 4-0 Monocryl subcuticular stitch with Dermabond and sterile dressing was placed on this site.  She was finally awakened, extubated, taken off of the Hana table into the recovery room in stable condition.  All final counts were correct. There were no complications noted.  Of note, Richardean Canal, PA-C was present through the entire case and his presence was crucial in getting the case done through all aspects of the case in fitting, position, and closure.     Vanita Panda. Magnus Ivan, M.D.     CYB/MEDQ  D:  11/04/2012  T:  11/05/2012  Job:  [id number]

## 2012-11-05 NOTE — Plan of Care (Signed)
Problem: Phase II Progression Outcomes Goal: Ambulates Outcome: Progressing Bilateral anterior total hip replacements

## 2012-11-06 LAB — CBC
MCH: 30.7 pg (ref 26.0–34.0)
MCHC: 33.8 g/dL (ref 30.0–36.0)
Platelets: 177 10*3/uL (ref 150–400)
RDW: 12.7 % (ref 11.5–15.5)

## 2012-11-06 MED ORDER — BISACODYL 10 MG RE SUPP: 10.0000 mg | Freq: Every day | RECTAL | Status: DC | PRN

## 2012-11-06 MED ORDER — FERROUS SULFATE 325 (65 FE) MG PO TABS: 325.0000 mg | ORAL_TABLET | Freq: Three times a day (TID) | ORAL | Status: DC

## 2012-11-06 MED ORDER — OXYCODONE HCL ER 10 MG PO T12A
10.0000 mg | EXTENDED_RELEASE_TABLET | Freq: Two times a day (BID) | ORAL | Status: DC
  Administered 2012-11-06 – 2012-11-07 (×3): 10 mg via ORAL
  Filled 2012-11-06 (×3): qty 1

## 2012-11-06 MED ORDER — METHOCARBAMOL 500 MG PO TABS: 500.0000 mg | ORAL_TABLET | Freq: Three times a day (TID) | ORAL | Status: DC

## 2012-11-06 MED ORDER — RIVAROXABAN 10 MG PO TABS: 10.0000 mg | ORAL_TABLET | Freq: Every day | ORAL | Status: DC

## 2012-11-06 MED ORDER — OXYCODONE-ACETAMINOPHEN 5-325 MG PO TABS: 1.0000 | ORAL_TABLET | ORAL | Status: DC | PRN

## 2012-11-06 MED ORDER — POLYETHYLENE GLYCOL 3350 17 G PO PACK
17.0000 g | PACK | Freq: Every day | ORAL | Status: DC
  Administered 2012-11-06 – 2012-11-07 (×3): 17 g via ORAL
  Filled 2012-11-06 (×3): qty 1

## 2012-11-06 NOTE — Progress Notes (Signed)
Physical Therapy Treatment Patient Details Name: Jordan Wade MRN: [id number] DOB: [birth year] Today's Date: 11/06/2012 Time: 8469-6295 PT Time Calculation (min): 23 min  PT Assessment / Plan / Recommendation Comments on Treatment Session  Pt progressing extremely well.  Possible d/c 03/31    Follow Up Recommendations  Home health PT     Does the patient have the potential to tolerate intense rehabilitation     Barriers to Discharge        Equipment Recommendations  None recommended by PT    Recommendations for Other Services OT consult  Frequency 7X/week   Plan Discharge plan remains appropriate    Precautions / Restrictions Precautions Precautions: Fall Restrictions Weight Bearing Restrictions: No Other Position/Activity Restrictions: WBAT   Pertinent Vitals/Pain Min c/o pain; premed    Mobility  Transfers Transfers: Sit to Stand;Stand to Sit Sit to Stand: 4: Min guard Stand to Sit: 4: Min guard Ambulation/Gait Ambulation/Gait Assistance: 4: Min guard Ambulation Distance (Feet): 289 Feet Assistive device: Rolling walker Ambulation/Gait Assistance Details: cues for posture, position from RW and stride length Gait Pattern: Step-through pattern;Decreased step length - left;Decreased step length - right Stairs: No    Exercises     PT Diagnosis:    PT Problem List:   PT Treatment Interventions:     PT Goals Acute Rehab PT Goals PT Goal Formulation: With patient Time For Goal Achievement: 11/10/12 Potential to Achieve Goals: Good Pt will go Supine/Side to Sit: with supervision PT Goal: Supine/Side to Sit - Progress: Progressing toward goal Pt will go Sit to Supine/Side: with supervision PT Goal: Sit to Supine/Side - Progress: Progressing toward goal Pt will go Sit to Stand: with supervision PT Goal: Sit to Stand - Progress: Progressing toward goal Pt will go Stand to Sit: with supervision PT Goal: Stand to Sit - Progress: Progressing toward goal Pt will  Ambulate: 51 - 150 feet;with supervision;with rolling walker PT Goal: Ambulate - Progress: Progressing toward goal Pt will Go Up / Down Stairs: 3-5 stairs;with min assist;with least restrictive assistive device  Visit Information  Last PT Received On: 11/06/12 Assistance Needed: +1    Subjective Data  Subjective: I'm so grateful they have the technology to do something like that Patient Stated Goal: Resume previous lifestyle with decreased pain   Cognition  Cognition Overall Cognitive Status: Appears within functional limits for tasks assessed/performed Arousal/Alertness: Awake/alert Orientation Level: Appears intact for tasks assessed Behavior During Session: [facility] for tasks performed    Balance     End of Session PT - End of Session Equipment Utilized During Treatment: Gait belt Activity Tolerance: Patient tolerated treatment well Patient left: in chair;with call bell/phone within reach Nurse Communication: Mobility status   GP     Jordan Wade 11/06/2012, 1:32 PM

## 2012-11-06 NOTE — Progress Notes (Signed)
Physical Therapy Treatment Patient Details Name: Jordan Wade MRN: [id number] DOB: 1956-07-14 Today's Date: 11/06/2012 Time: 8295-6213 PT Time Calculation (min): 38 min  PT Assessment / Plan / Recommendation Comments on Treatment Session       Follow Up Recommendations  Home health PT     Does the patient have the potential to tolerate intense rehabilitation     Barriers to Discharge        Equipment Recommendations  None recommended by PT    Recommendations for Other Services OT consult  Frequency 7X/week   Plan Discharge plan remains appropriate    Precautions / Restrictions Precautions Precautions: Fall Restrictions Weight Bearing Restrictions: No Other Position/Activity Restrictions: WBAT   Pertinent Vitals/Pain 3-4/10; ice packs provided    Mobility  Bed Mobility Bed Mobility: Supine to Sit Supine to Sit: 5: Supervision Details for Bed Mobility Assistance: Pt slowly walking LEs to EOB without assist Transfers Transfers: Sit to Stand;Stand to Sit Sit to Stand: 4: Min guard Stand to Sit: 4: Min guard Details for Transfer Assistance: cues for use of UEs and for LE management.   Ambulation/Gait Ambulation/Gait Assistance: 4: Min assist;4: Min Government social research officer (Feet): 187 Feet Assistive device: Rolling walker Ambulation/Gait Assistance Details: min cues for position from RW - pt progressing to recip gait Gait Pattern: Step-to pattern;Step-through pattern;Decreased step length - right;Decreased step length - left Stairs: No    Exercises Total Joint Exercises Ankle Circles/Pumps: AROM;Supine;Both;20 reps Quad Sets: AROM;Both;Supine;15 reps Gluteal Sets: AROM;Both;10 reps;Supine Heel Slides: AAROM;Both;Supine;20 reps Hip ABduction/ADduction: AAROM;Both;Supine;20 reps   PT Diagnosis:    PT Problem List:   PT Treatment Interventions:     PT Goals Acute Rehab PT Goals PT Goal Formulation: With patient Time For Goal Achievement:  11/10/12 Potential to Achieve Goals: Good Pt will go Supine/Side to Sit: with supervision PT Goal: Supine/Side to Sit - Progress: Progressing toward goal Pt will go Sit to Supine/Side: with supervision PT Goal: Sit to Supine/Side - Progress: Progressing toward goal Pt will go Sit to Stand: with supervision PT Goal: Sit to Stand - Progress: Progressing toward goal Pt will go Stand to Sit: with supervision PT Goal: Stand to Sit - Progress: Progressing toward goal Pt will Ambulate: 51 - 150 feet;with supervision;with rolling walker PT Goal: Ambulate - Progress: Progressing toward goal Pt will Go Up / Down Stairs: 3-5 stairs;with min assist;with least restrictive assistive device  Visit Information  Last PT Received On: 11/06/12 Assistance Needed: +1    Subjective Data  Subjective: I am doing good and haven't taken any pain medicine since last night Patient Stated Goal: Resume previous lifestyle with decreased pain   Cognition  Cognition Overall Cognitive Status: Appears within functional limits for tasks assessed/performed Arousal/Alertness: Awake/alert Orientation Level: Appears intact for tasks assessed Behavior During Session: [facility] for tasks performed    Balance     End of Session PT - End of Session Equipment Utilized During Treatment: Gait belt Activity Tolerance: Patient tolerated treatment well Patient left: in chair;with call bell/phone within reach Nurse Communication: Mobility status   GP     Jordan Wade 11/06/2012, 8:55 AM

## 2012-11-06 NOTE — Progress Notes (Signed)
Subjective: 2 Days Post-Op Procedure(s) (LRB): BILATERAL ANTERIOR TOTAL HIP ARTHROPLASTY (Bilateral) Patient reports pain as moderate.    Objective: Vital signs in last 24 hours: Temp:  [98.8 F (37.1 C)-99.1 F (37.3 C)] 98.8 F (37.1 C) (03/30 0515) Pulse Rate:  [90-100] 100 (03/30 0515) Resp:  [16] 16 (03/30 0515) BP: (111-113)/(53-65) 111/65 mmHg (03/30 0515) SpO2:  [100 %] 100 % (03/30 0515)  Intake/Output from previous day: 03/29 0701 - 03/30 0700 In: 1217.5 [P.O.:780; I.V.:437.5] Out: 3850 [Urine:3850] Intake/Output this shift: Total I/O In: 240 [P.O.:240] Out: 600 [Urine:600]   Recent Labs  11/05/12 0515 11/06/12 0430  HGB 9.2* 9.4*    Recent Labs  11/05/12 0515 11/06/12 0430  WBC 12.1* 10.4  RBC 3.00* 3.06*  HCT 27.3* 27.8*  PLT 207 177    Recent Labs  11/05/12 0515  NA 133*  K 4.0  CL 99  CO2 28  BUN 13  CREATININE 0.64  GLUCOSE 133*  CALCIUM 8.1*   No results found for this basename: LABPT, INR,  in the last 72 hours  Neurologically intact  Assessment/Plan: 2 Days Post-Op Procedure(s) (LRB): BILATERAL ANTERIOR TOTAL HIP ARTHROPLASTY (Bilateral) Up with therapy possible home tomorrow.   Leng Montesdeoca C 11/06/2012, 11:05 AM

## 2012-11-06 NOTE — Care Management Note (Addendum)
    Page 1 of 2   11/07/2012     12:08:28 PM   CARE MANAGEMENT NOTE 11/07/2012  Patient:  Jordan Wade, Jordan Wade   Account Number:  [id number]  Date Initiated:  11/05/2012  Documentation initiated by:  Wade,Jordan  Subjective/Objective Assessment:   57 yo female admitted s/p BILATERAL ANTERIOR TOTAL HIP ARTHROPLAST.     Action/Plan:   Home with HH services   Anticipated DC Date:  11/07/2012   Anticipated DC Plan:  HOME W HOME HEALTH SERVICES      DC Planning Services  CM consult      Choice offered to / List presented to:  [name]        HH arranged  HH-2 PT      [facility] agency  [facility]   Status of service:  Completed, signed off Medicare Important Message given?   (If response is "NO", the following Medicare IM given date fields will be blank) Date Medicare IM given:   Date Additional Medicare IM given:    Discharge Disposition:  HOME W HOME HEALTH SERVICES  Per UR Regulation:  Reviewed for med. necessity/level of care/duration of stay  If discussed at Long Length of Stay Meetings, dates discussed:    Comments:  11/06/12 Jordan MAHABIR RN,BSN NCM [phone] MD,WOULD YOU PLEASE ORDER HHPT.TC TO Jordan Wade W/[facility] LEFT MESSAGE ON TEL#[phone],INFORMING OF HH ORDERS HHOT/NURSE'S AIDE,& MAY D/C IN AM,LEFT CALL BACK#,FAXED ORDERS W/CONFIRMATION TO [phone].  11/05/12 1700 Jordan Davis,RN,BSN [phone] Cm spoke with patient concerning discharge planning. Per pt choice [facility] to provide [facility] services upon discharge. Jordan Wade weekend rep Jordan Wade made aware, spoke with patient at bedside concerning [facility] agency services. Pt states spouse to assist in home care. Pt states having DME needs for home use. No other needs stated. Awaiting MD orders.

## 2012-11-06 NOTE — Plan of Care (Signed)
Problem: Phase III Progression Outcomes Goal: Anticoagulant follow-up in place Outcome: Not Applicable Date Met:  11/06/12 On xarelto

## 2012-11-07 ENCOUNTER — Encounter (HOSPITAL_COMMUNITY): Payer: Self-pay | Admitting: Orthopaedic Surgery

## 2012-11-07 LAB — CBC
Platelets: 180 10*3/uL (ref 150–400)
RDW: 12.5 % (ref 11.5–15.5)
WBC: 10 10*3/uL (ref 4.0–10.5)

## 2012-11-07 NOTE — Progress Notes (Signed)
Patient ID: Jordan Wade, female   DOB: 10-May-1956, 57 y.o.   MRN: [id number] Doing well. AF/VSS.  Can discharge to home today with HHPT after works with stairs.

## 2012-11-07 NOTE — Progress Notes (Signed)
Physical Therapy Treatment Patient Details Name: Jordan Wade MRN: [id number] DOB: May 15, 1956 Today's Date: 11/07/2012 Time: 4098-1191 PT Time Calculation (min): 54 min  PT Assessment / Plan / Recommendation Comments on Treatment Session  POD # 3 B Direct Anterior THR planning to D/C to home today.  Practiced 5 steps one rail forward with spouse.  Given handout on HEP.  Instructed on proper car transfer.  Instructed on proper sleeping positioning of supine and sidelying.  Instructed on use of ICE.    Follow Up Recommendations  Home health PT     Does the patient have the potential to tolerate intense rehabilitation     Barriers to Discharge        Equipment Recommendations  None recommended by PT    Recommendations for Other Services    Frequency 7X/week   Plan Discharge plan remains appropriate    Precautions / Restrictions Precautions Precautions: None Precaution Comments: Direct Anterior THR Restrictions Weight Bearing Restrictions: No Other Position/Activity Restrictions: WBAT   Pertinent Vitals/Pain C/o L hip pain > R but "not bad" ICE applied    Mobility  Bed Mobility Bed Mobility: Supine to Sit Supine to Sit: 6: Modified independent (Device/Increase time) Details for Bed Mobility Assistance: only required increased time Transfers Transfers: Sit to Stand;Stand to Sit Sit to Stand: 6: Modified independent (Device/Increase time);From bed;From toilet Stand to Sit: 6: Modified independent (Device/Increase time);To toilet;To chair/3-in-1 Details for Transfer Assistance: increased time Ambulation/Gait Ambulation/Gait Assistance: 6: Modified independent (Device/Increase time);5: Supervision Ambulation Distance (Feet): 285 Feet Assistive device: Rolling walker Ambulation/Gait Assistance Details: good alternating gait Gait Pattern: Step-through pattern;Decreased step length - left;Decreased step length - right Stairs: Yes Stairs Assistance: 4: Min assist Stairs  Assistance Details (indicate cue type and reason): with spouse assisting on opposite side of rail and 25% VC's on proper sequencing and safety Stair Management Technique: One rail Left;Forwards  Total Hip Replacement TE's 10 reps ankle pumps 10 reps knee presses 10 reps heel slides 10 reps SAQ's 10 reps ABD Followed by ICE    PT Goals                                      progressing    Visit Information  Last PT Received On: 11/07/12    Subjective Data      Cognition    good   Balance   good  End of Session PT - End of Session Equipment Utilized During Treatment: Gait belt Activity Tolerance: Patient tolerated treatment well Patient left: in chair;with call bell/phone within reach   Felecia Shelling  PTA [facility]  Acute  Rehab Pager      [phone]

## 2012-11-07 NOTE — Discharge Summary (Signed)
Patient ID: Jordan Wade MRN: [id number] DOB/AGE: 08-28-1955 57 y.o.  Admit date: 11/04/2012 Discharge date: 11/07/2012  Admission Diagnoses:  Principal Problem:   Degenerative arthritis of hip   Discharge Diagnoses:  Same  Past Medical History  Diagnosis Date  . PONV (postoperative nausea and vomiting)   . Arthritis     Surgeries: Procedure(s): BILATERAL ANTERIOR TOTAL HIP ARTHROPLASTY on 11/04/2012   Consultants:    Discharged Condition: Improved  Hospital Course: Jordan Wade is an 57 y.o. female who was admitted 11/04/2012 for operative treatment ofDegenerative arthritis of hip. Patient has severe unremitting pain that affects sleep, daily activities, and work/hobbies. After pre-op clearance the patient was taken to the operating room on 11/04/2012 and underwent  Procedure(s): BILATERAL ANTERIOR TOTAL HIP ARTHROPLASTY.    Patient was given perioperative antibiotics: Anti-infectives   Start     Dose/Rate Route Frequency Ordered Stop   11/04/12 1900  ceFAZolin (ANCEF) IVPB 1 g/50 mL premix     1 g 100 mL/hr over 30 Minutes Intravenous Every 6 hours 11/04/12 1806 11/05/12 0137   11/04/12 1130  ceFAZolin (ANCEF) IVPB 2 g/50 mL premix     2 g 100 mL/hr over 30 Minutes Intravenous On call to O.R. 11/04/12 1054 11/04/12 1310       Patient was given sequential compression devices, early ambulation, and chemoprophylaxis to prevent DVT.  Patient benefited maximally from hospital stay and there were no complications.    Recent vital signs: Patient Vitals for the past 24 hrs:  BP Temp Temp src Pulse Resp SpO2  11/07/12 0505 113/70 mmHg 99.2 F (37.3 C) Oral 94 12 100 %  11/06/12 2126 112/74 mmHg 99.5 F (37.5 C) Oral 89 16 95 %  11/06/12 1600 - - - - 16 100 %  11/06/12 1536 113/71 mmHg 99.3 F (37.4 C) Oral 101 16 100 %  11/06/12 1200 - - - - 16 100 %  11/06/12 0800 - - - - 16 100 %     Recent laboratory studies:  Recent Labs  11/05/12 0515 11/06/12 0430  11/07/12 0413  WBC 12.1* 10.4 10.0  HGB 9.2* 9.4* 9.0*  HCT 27.3* 27.8* 26.4*  PLT 207 177 180  NA 133*  --   --   K 4.0  --   --   CL 99  --   --   CO2 28  --   --   BUN 13  --   --   CREATININE 0.64  --   --   GLUCOSE 133*  --   --   CALCIUM 8.1*  --   --      Discharge Medications:     Medication List    STOP taking these medications       aspirin 81 MG tablet     ibuprofen 200 MG tablet  Commonly known as:  ADVIL,MOTRIN     traMADol 50 MG tablet  Commonly known as:  ULTRAM      TAKE these medications       Calcium Carb-Cholecalciferol [phone] MG-UNIT Caps  Take 1 capsule by mouth daily.     ferrous sulfate 325 (65 FE) MG tablet  Take 1 tablet (325 mg total) by mouth 3 (three) times daily after meals.     glucosamine-chondroitin 500-400 MG tablet  Take 1 tablet by mouth 2 (two) times daily.     methocarbamol 500 MG tablet  Commonly known as:  ROBAXIN  Take 1 tablet (500 mg total) by mouth  3 (three) times daily.     oxyCODONE-acetaminophen 5-325 MG per tablet  Commonly known as:  ROXICET  Take 1-2 tablets by mouth every 4 (four) hours as needed for pain.     rivaroxaban 10 MG Tabs tablet  Commonly known as:  XARELTO  Take 1 tablet (10 mg total) by mouth daily with breakfast.        Diagnostic Studies: Dg Hip Complete Left  11/04/2012  *RADIOLOGY REPORT*  Clinical Data: Left hip replacement, PACU exam  LEFT HIP - COMPLETE 2+ VIEW  Comparison: 11/04/2012  Findings: Single intraoperative spot fluoroscopic view of the left hip.  This demonstrates bipolar hip arthroplasty.  Components appear aligned in the frontal plane.  No definite hardware or osseous abnormality.  IMPRESSION: Expected postoperative appearance of a left hip arthroplasty.   Original Report Authenticated By: Judie Petit. Shick, M.D.    Dg Hip Complete Right  11/04/2012  *RADIOLOGY REPORT*  Clinical Data: Bilateral hip replacements, hip osteoarthritis  DG C-ARM 61-120 MIN - NRPT [facility],RIGHT HIP -  COMPLETE 2+ VIEW  Fluoroscopy time 47 seconds  Comparison: 11/04/2012  Findings: Bilateral hip arthroplasties have been performed.  Right hip hardware appears aligned in the frontal plane.  No malalignment or acute osseous abnormality.  No hardware abnormality.  IMPRESSION: Expected appearance status post hip arthroplasties.   Original Report Authenticated By: Judie Petit. Shick, M.D.    Dg Pelvis Portable  11/04/2012  *RADIOLOGY REPORT*  Clinical Data: Post bilateral hip replacement  PORTABLE PELVIS  Comparison: Portable exam 1640 hours correlate with intraoperative C-arm fluoroscopic images of 11/04/2012  Findings: Acetabular and femoral components of bilateral hip prostheses are identified. Diffuse osseous demineralization. No fracture, dislocation or bone destruction. No periprosthetic lucencies. Expected postsurgical soft tissue changes. SI joints symmetric.  IMPRESSION: Bilateral hip prostheses. Osseous demineralization. No acute abnormalities.   Original Report Authenticated By: Ulyses Southward, M.D.    Mm Digital Screening  10/12/2012  *RADIOLOGY REPORT*  Clinical Data: Screening.  DIGITAL BILATERAL SCREENING MAMMOGRAM WITH CAD DIGITAL BREAST TOMOSYNTHESIS  Digital breast tomosynthesis images are acquired in two projections.  These images are reviewed in combination with the digital mammogram, confirming the findings below.  Comparison:  Previous exams.  FINDINGS:  ACR Breast Density Category 2: There is a scattered fibroglandular pattern.  No suspicious masses, architectural distortion, or calcifications are present.  Images were processed with CAD.  IMPRESSION: No mammographic evidence of malignancy.  A result letter of this screening mammogram will be mailed directly to the patient.  RECOMMENDATION: Screening mammogram in one year. (Code:SM-B-01Y)  BI-RADS CATEGORY 1:  Negative.   Original Report Authenticated By: Norva Pavlov, M.D.    Dg C-arm 61-120 Min-no Report  11/04/2012  *RADIOLOGY REPORT*  Clinical  Data: Bilateral hip replacements, hip osteoarthritis  DG C-ARM 61-120 MIN - [facility],RIGHT HIP - COMPLETE 2+ VIEW  Fluoroscopy time 47 seconds  Comparison: 11/04/2012  Findings: Bilateral hip arthroplasties have been performed.  Right hip hardware appears aligned in the frontal plane.  No malalignment or acute osseous abnormality.  No hardware abnormality.  IMPRESSION: Expected appearance status post hip arthroplasties.   Original Report Authenticated By: Judie Petit. Miles Costain, M.D.     Disposition:  To home      Discharge Orders   Future Orders Complete By Expires     Call MD / Call 911  As directed     Comments:      If you experience chest pain or shortness of breath, CALL 911 and be transported to the  hospital emergency room.  If you develope a fever above 101 F, pus (white drainage) or increased drainage or redness at the wound, or calf pain, call your surgeon's office.    Constipation Prevention  As directed     Comments:      Drink plenty of fluids.  Prune juice may be helpful.  You may use a stool softener, such as Colace (over the counter) 100 mg twice a day.  Use MiraLax (over the counter) for constipation as needed.    Diet - low sodium heart healthy  As directed     Discharge instructions  As directed     Comments:      Increase your activities as comfort allows. Ice as needed for swelling. You can remove your dressings 11/09/12 and shower, getting your incisions wet; then new dry dressing daily    Discharge patient  As directed     Discharge wound care:  As directed     Comments:      May remove dressing Wednesday and shower    Increase activity slowly as tolerated  As directed     Weight bearing as tolerated  As directed        Follow-up Information   Follow up with Kathryne Hitch, MD.   Contact information:   [address] [place] [address] [phone]       Follow up with Kathryne Hitch, MD. Schedule an appointment as soon as possible for a visit in  2 weeks.   Contact information:   [address] [place] [address] [phone]        Signed: Kathryne Hitch 11/07/2012, 7:40 AM

## 2012-11-07 NOTE — Progress Notes (Signed)
Pt for d/c home today with HHPT per Genevieve Norlander. No IV noted. Dressing CDI to bil. Hip with Aquacel. Dressing supplies provided for home use. Ambulated with PT using RW & did steps w/o any problems. Husband at bedside & PT gave him instrutions for home care, mobility & ambulation. D/C insructions & Rx given with verbalized understanding.

## 2012-11-08 ENCOUNTER — Other Ambulatory Visit (HOSPITAL_COMMUNITY): Payer: BC Managed Care – PPO

## 2012-11-15 ENCOUNTER — Inpatient Hospital Stay: Admit: 2012-11-15 | Payer: Self-pay | Admitting: Orthopaedic Surgery

## 2012-11-15 SURGERY — ARTHROPLASTY, HIP, BILATERAL, TOTAL, ANTERIOR APPROACH
Anesthesia: General | Site: Hip | Laterality: Bilateral

## 2013-02-23 ENCOUNTER — Other Ambulatory Visit: Payer: Self-pay | Admitting: Dermatology

## 2013-07-14 IMAGING — CR DG PORTABLE PELVIS
1 series · 1 of 1 positions shown · non-contrast
Comparison: Portable exam 8577 hours correlate with intraoperative
C-arm fluoroscopic images of 11/04/2012

CLINICAL DATA: Post bilateral hip replacement

PORTABLE PELVIS

[AP]
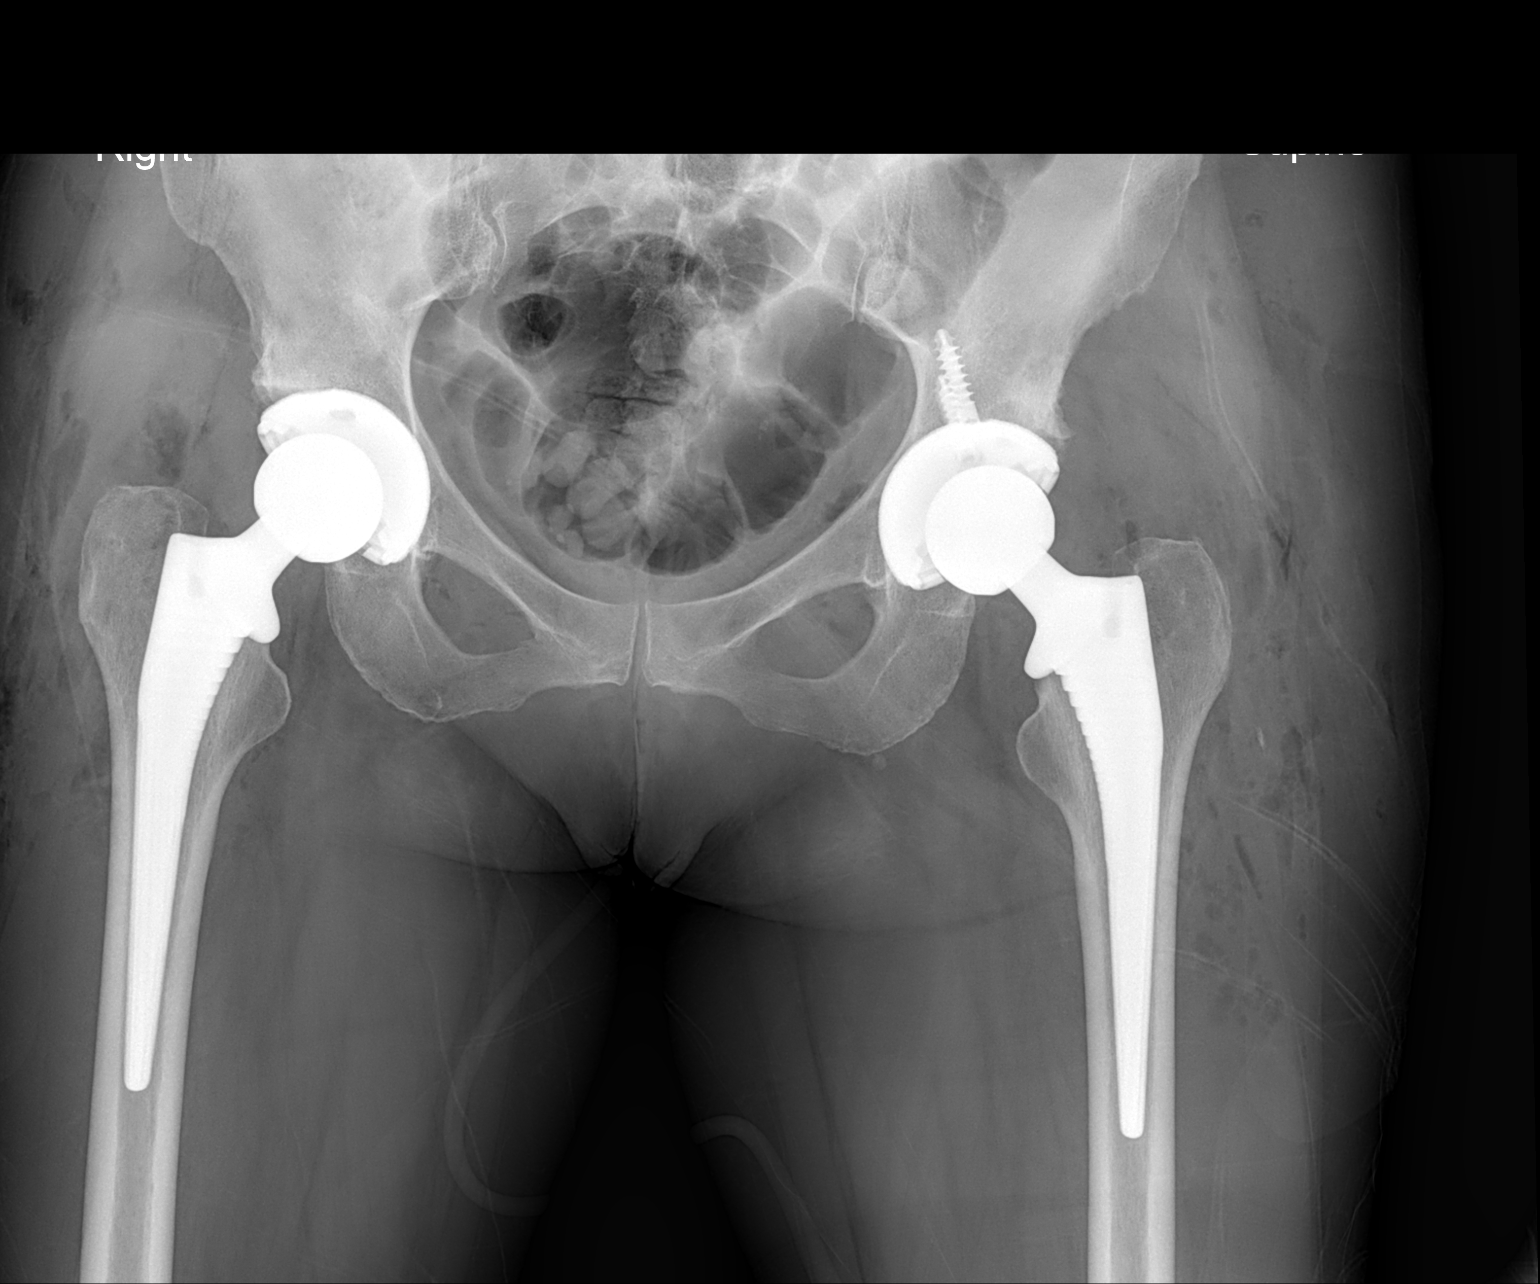

[1 of 1 positions shown; findings below may reference images not displayed]

FINDINGS: Acetabular and femoral components of bilateral hip prostheses are
identified.
Diffuse osseous demineralization.
No fracture, dislocation or bone destruction.
No periprosthetic lucencies.
Expected postsurgical soft tissue changes.
SI joints symmetric.
IMPRESSION: Bilateral hip prostheses.
Osseous demineralization.
No acute abnormalities.

## 2013-10-26 ENCOUNTER — Other Ambulatory Visit: Payer: Self-pay

## 2013-10-26 DIAGNOSIS — Z1231 Encounter for screening mammogram for malignant neoplasm of breast: Secondary | ICD-10-CM

## 2013-11-06 ENCOUNTER — Ambulatory Visit
Admission: RE | Admit: 2013-11-06 | Discharge: 2013-11-06 | Disposition: A | Discharge status: Home / Self Care | Payer: BC Managed Care – PPO | Source: Ambulatory Visit

## 2013-11-06 DIAGNOSIS — Z1231 Encounter for screening mammogram for malignant neoplasm of breast: Secondary | ICD-10-CM

## 2013-11-23 LAB — HM COLONOSCOPY

## 2014-08-06 LAB — HM DEXA SCAN

## 2014-10-10 ENCOUNTER — Other Ambulatory Visit: Payer: Self-pay

## 2014-10-10 DIAGNOSIS — Z1231 Encounter for screening mammogram for malignant neoplasm of breast: Secondary | ICD-10-CM

## 2014-11-08 ENCOUNTER — Ambulatory Visit
Admission: RE | Admit: 2014-11-08 | Discharge: 2014-11-08 | Disposition: A | Discharge status: Home / Self Care | Payer: BC Managed Care – PPO | Source: Ambulatory Visit

## 2014-11-08 DIAGNOSIS — Z1231 Encounter for screening mammogram for malignant neoplasm of breast: Secondary | ICD-10-CM

## 2015-08-14 ENCOUNTER — Other Ambulatory Visit (HOSPITAL_COMMUNITY)
Admission: RE | Admit: 2015-08-14 | Discharge: 2015-08-14 | Disposition: A | Discharge status: Home / Self Care | Payer: BC Managed Care – PPO | Source: Ambulatory Visit | Attending: Internal Medicine | Admitting: Internal Medicine

## 2015-08-14 ENCOUNTER — Other Ambulatory Visit: Payer: Self-pay | Admitting: Internal Medicine

## 2015-08-14 DIAGNOSIS — Z01419 Encounter for gynecological examination (general) (routine) without abnormal findings: Secondary | ICD-10-CM | POA: Diagnosis present

## 2015-08-14 DIAGNOSIS — Z1151 Encounter for screening for human papillomavirus (HPV): Secondary | ICD-10-CM | POA: Insufficient documentation

## 2015-08-14 LAB — CBC AND DIFFERENTIAL
HEMATOCRIT: 40 % (ref 36–46)
Hemoglobin: 13.3 g/dL (ref 12.0–16.0)
Platelets: 267 10*3/uL (ref 150–399)
WBC: 8.1 10*3/mL

## 2015-08-14 LAB — LIPID PANEL
Cholesterol: 204 mg/dL — AB (ref 0–200)
HDL: 51 mg/dL (ref 35–70)
LDL CALC: 130 mg/dL
Triglycerides: 117 mg/dL (ref 40–160)

## 2015-08-14 LAB — BASIC METABOLIC PANEL
BUN: 17 mg/dL (ref 4–21)
Creatinine: 0.8 mg/dL (ref <=1.1)
GLUCOSE: 77 mg/dL
Potassium: 4.6 mmol/L (ref 3.4–5.3)
SODIUM: 139 mmol/L (ref 137–147)

## 2015-08-14 LAB — HEPATIC FUNCTION PANEL
ALT: 21 U/L (ref 7–35)
AST: 20 U/L (ref 13–35)
Alkaline Phosphatase: 55 U/L (ref 25–125)
BILIRUBIN, TOTAL: 0.4 mg/dL

## 2015-08-14 LAB — TSH: TSH: 2.55 u[IU]/mL (ref <=5.90)

## 2015-08-19 LAB — CYTOLOGY - PAP

## 2015-10-30 ENCOUNTER — Other Ambulatory Visit: Payer: Self-pay

## 2015-10-30 DIAGNOSIS — Z1231 Encounter for screening mammogram for malignant neoplasm of breast: Secondary | ICD-10-CM

## 2015-11-11 ENCOUNTER — Ambulatory Visit
Admission: RE | Admit: 2015-11-11 | Discharge: 2015-11-11 | Disposition: A | Discharge status: Home / Self Care | Payer: BC Managed Care – PPO | Source: Ambulatory Visit

## 2015-11-11 DIAGNOSIS — Z1231 Encounter for screening mammogram for malignant neoplasm of breast: Secondary | ICD-10-CM

## 2016-04-15 ENCOUNTER — Encounter: Payer: Self-pay | Admitting: Family Medicine

## 2016-04-15 ENCOUNTER — Ambulatory Visit (INDEPENDENT_AMBULATORY_CARE_PROVIDER_SITE_OTHER): Payer: BC Managed Care – PPO | Admitting: Family Medicine

## 2016-04-15 VITALS — BP 132/76 | HR 80 | Ht 61.5 in | Wt 137.1 lb

## 2016-04-15 DIAGNOSIS — M159 Polyosteoarthritis, unspecified: Secondary | ICD-10-CM | POA: Diagnosis not present

## 2016-04-15 DIAGNOSIS — E663 Overweight: Secondary | ICD-10-CM | POA: Diagnosis not present

## 2016-04-15 DIAGNOSIS — Z8739 Personal history of other diseases of the musculoskeletal system and connective tissue: Secondary | ICD-10-CM | POA: Diagnosis not present

## 2016-04-15 DIAGNOSIS — Z832 Family history of diseases of the blood and blood-forming organs and certain disorders involving the immune mechanism: Secondary | ICD-10-CM

## 2016-04-15 DIAGNOSIS — Z800 Family history of malignant neoplasm of digestive organs: Secondary | ICD-10-CM

## 2016-04-15 DIAGNOSIS — Z83438 Family history of other disorder of lipoprotein metabolism and other lipidemia: Secondary | ICD-10-CM

## 2016-04-15 DIAGNOSIS — Z823 Family history of stroke: Secondary | ICD-10-CM

## 2016-04-15 DIAGNOSIS — Z96643 Presence of artificial hip joint, bilateral: Secondary | ICD-10-CM

## 2016-04-15 NOTE — Progress Notes (Signed)
New patient office visit note:  Impression and Recommendations:    1. Generalized OA   2. BMI >er 25.0   3. History of osteopenia   4. Family history of colon cancer   5. Family history of mixed hyperlipidemia   6. Family history of stroke or transient ischemic attack in mother   60. H/O bilateral hip replacements    Extensive counseling done regarding dietary and lifestyle modifications to help minimize risk of diseases including but not limited to diabetes, hypertension, hyperlipidemia, osteoporosis, dementia, depression etc.  AHA guidelines for exercise reviewed.   Educational handouts provided.  We'll obtain yearly lab work which patient states she is not due until January 2018 for. Orders placed today.    Orders Placed This Encounter  Procedures  . CBC with Differential/Platelet  . COMPLETE METABOLIC PANEL WITH GFR  . Hemoglobin A1c  . Lipid panel  . TSH  . VITAMIN D 25 Hydroxy (Vit-D Deficiency, Fractures)  . Hepatitis C antibody    Patient's Medications  New Prescriptions   No medications on file  Previous Medications   ASPIRIN 81 MG CHEWABLE TABLET    Chew 81 mg by mouth daily.   CALCIUM CARB-CHOLECALCIFEROL [phone] MG-UNIT CAPS    Take 1 capsule by mouth daily.   NAPROXEN SODIUM (ANAPROX) 220 MG TABLET    Take 220 mg by mouth as needed.   OMEGA-3 FATTY ACIDS (FISH OIL) 1000 MG CAPS    Take 1 capsule by mouth daily.  Modified Medications   No medications on file  Discontinued Medications   FERROUS SULFATE 325 (65 FE) MG TABLET    Take 1 tablet (325 mg total) by mouth 3 (three) times daily after meals.   GLUCOSAMINE-CHONDROITIN 500-400 MG TABLET    Take 1 tablet by mouth 2 (two) times daily.   METHOCARBAMOL (ROBAXIN) 500 MG TABLET    Take 1 tablet (500 mg total) by mouth 3 (three) times daily.   OXYCODONE-ACETAMINOPHEN (ROXICET) 5-325 MG PER TABLET    Take 1-2 tablets by mouth every 4 (four) hours as needed for pain.   RIVAROXABAN (XARELTO) 10 MG  TABS TABLET    Take 1 tablet (10 mg total) by mouth daily with breakfast.    Return in about 4 months (around 08/14/2016) for Fasting blood work-patient due in January, then wellness exam & health maintenance eval 2wks later .  The patient was counseled, risk factors were discussed, anticipatory guidance given. Pt was in the office today for 40+ minutes, with over 50% time spent in face to face counseling of various medical concerns and in coordination of care Gross side effects, risk and benefits, and alternatives of medications discussed with patient.  Patient is aware that all medications have potential side effects and we are unable to predict every side effect or drug-drug interaction that may occur.  Expresses verbal understanding and consents to current therapy plan and treatment regimen.  Please see AVS handed out to patient at the end of our visit for further patient instructions/ counseling done pertaining to today's office visit.    Note: This document was prepared using Dragon voice recognition software and may include unintentional dictation errors.  ----------------------------------------------------------------------------------------------------------------------    Subjective:    Chief Complaint  Patient presents with  . Establish Care    HPI: ANOVA QUIRKE is a pleasant 60 y.o. female who presents to [place] at [facility] today to review their medical history with me and  establish care.   - Previously seen at [facility] practices:   - Goes to gastroenterologist through [facility] and gets every 5 year colonoscopies. Last one fall 2015 which was completely clear per patient. - Mammogram March 2017-normal - Pap smear December 2016-normal   [facility]- born / raised in [place].  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   Goes to gym- [employer],  tries to 3 days per week minimum, 60 minutes total of cardio and  strength training.  For 10 yrs.    Primary health concerns is how to stay healthy and try to prevent having to go on medications as much as possible.  Had bone loss on bone density- not sure when last one was done. She will look through her medical records and let us know..     BOth hips replaced at same time- [facility]- '14.      Wt Readings from Last 3 Encounters:  04/15/16 137 lb 1.6 oz (62.2 kg)  11/04/12 136 lb (61.7 kg)  10/28/12 136 lb (61.7 kg)   BP Readings from Last 3 Encounters:  04/15/16 132/76  11/07/12 113/70  10/28/12 (!) 144/85   Pulse Readings from Last 3 Encounters:  04/15/16 80  11/07/12 94  10/28/12 72     Patient Active Problem List   Diagnosis Date Noted  . History of osteopenia 04/17/2016    Priority: High  . Generalized OA 04/17/2016    Priority: High  . Overweight (BMI 25.0-29.9) 04/17/2016    Priority: Medium  . H/O bilateral hip replacements 04/17/2016    Priority: Medium  . Family history of colon cancer 04/17/2016    Priority: Low  . Family history of mixed hyperlipidemia 04/17/2016    Priority: Low  . Family history of stroke or transient ischemic attack in mother 04/17/2016    Priority: Low     Past Medical History:  Diagnosis Date  . Arthritis   . PONV (postoperative nausea and vomiting)      Past Surgical History:  Procedure Laterality Date  . BILATERAL ANTERIOR TOTAL HIP ARTHROPLASTY Bilateral 11/04/2012   Procedure: BILATERAL ANTERIOR TOTAL HIP ARTHROPLASTY;  Surgeon: Mcarthur Rossetti, MD;  Location: [facility];  Service: Orthopedics;  Laterality: Bilateral;  . CESAREAN SECTION     x 2     Family History  Problem Relation Age of Onset  . Cancer Mother     colon  . Hyperlipidemia Mother   . Stroke Mother   . Hypertension Father   . Cancer Maternal Grandfather     colon  . Diabetes Maternal Grandfather      History  Drug Use No    History  Alcohol Use No    History  Smoking Status  .  Never Smoker  Smokeless Tobacco  . Never Used    Patient's Medications  New Prescriptions   No medications on file  Previous Medications   ASPIRIN 81 MG CHEWABLE TABLET    Chew 81 mg by mouth daily.   CALCIUM CARB-CHOLECALCIFEROL [phone] MG-UNIT CAPS    Take 1 capsule by mouth daily.   NAPROXEN SODIUM (ANAPROX) 220 MG TABLET    Take 220 mg by mouth as needed.   OMEGA-3 FATTY ACIDS (FISH OIL) 1000 MG CAPS    Take 1 capsule by mouth daily.  Modified Medications   No medications on file  Discontinued Medications   FERROUS SULFATE 325 (65 FE) MG TABLET    Take 1  tablet (325 mg total) by mouth 3 (three) times daily after meals.   GLUCOSAMINE-CHONDROITIN 500-400 MG TABLET    Take 1 tablet by mouth 2 (two) times daily.   METHOCARBAMOL (ROBAXIN) 500 MG TABLET    Take 1 tablet (500 mg total) by mouth 3 (three) times daily.   OXYCODONE-ACETAMINOPHEN (ROXICET) 5-325 MG PER TABLET    Take 1-2 tablets by mouth every 4 (four) hours as needed for pain.   RIVAROXABAN (XARELTO) 10 MG TABS TABLET    Take 1 tablet (10 mg total) by mouth daily with breakfast.    Allergies: Review of patient's allergies indicates no known allergies.  Review of Systems:   ( Completed via Adult Medical History Intake form today ) General:  Denies fever, chills, appetite changes, unexplained weight loss.  Optho/Auditory:   Denies visual changes, blurred vision/LOV, ringing in ears/ diff hearing Respiratory:   Denies SOB, DOE, cough, wheezing.  Cardiovascular:   Denies chest pain, palpitations, new onset peripheral edema  Gastrointestinal:   Denies nausea, vomiting, diarrhea.  Genitourinary:    Denies dysuria, increased frequency, flank pain.  Endocrine:     Denies hot or cold intolerance, polyuria, polydipsia. Musculoskeletal:  Denies unexplained myalgias, joint swelling, arthralgias, gait problems. + Age related muscle and joint pains which are generalized  Skin:  Denies rash, suspicious lesions or new/ changes in  moles Neurological:    Denies dizziness, syncope, unexplained weakness, lightheadedness, numbness  Psychiatric/Behavioral:   Denies mood changes, suicidal or homicidal ideations, hallucinations    Objective:   Blood pressure 132/76, pulse 80, height 5' 1.5" (1.562 m), weight 137 lb 1.6 oz (62.2 kg). Body mass index is 25.49 kg/m. General: Well Developed, well nourished, and in no acute distress.  Neuro: Alert and oriented x3, extra-ocular muscles intact, sensation grossly intact.  HEENT: Normocephalic, atraumatic, pupils equal round reactive to light, neck supple, no gross masses, no carotid bruits, no JVD apprec Skin: no gross suspicious lesions or rashes  Cardiac: Regular rate and rhythm, no murmurs rubs or gallops.  Respiratory: Essentially clear to auscultation bilaterally. Not using accessory muscles, speaking in full sentences.  Abdominal: Soft, not grossly distended Musculoskeletal: Ambulates w/o diff, FROM * 4 ext.  Vasc: less 2 sec cap RF, warm and pink  Psych:  No HI/SI, judgement and insight good, Euthymic mood. Full Affect.

## 2016-04-15 NOTE — Patient Instructions (Addendum)
Top Ten Foods for Health  1. Water Drink at least 8 to 12 cups of water daily. Consume half of your body weight in pounds, is the amount of water in ounces to drink daily.  Ie: a 200lb person = 100 oz water daily  2. Dark Green Vegetables Eat dark green vegetables at least three to four times a week. Good options include broccoli, peppers, brussel sprouts and leafy greens like kale and spinach.  3. Whole Grains Whole grains should be included in your diet at least two to three times daily. Look for whole wheat flour, rye, oatmeal, barley, amaranth, quinoa or a multigrain. A good source of fiber includes 3 to 4 grams of fiber per serving. A great source has 5 or more grams of fiber per serving.  4. Beans and Lentils Try to eat a bean-based meal at least once a week. Try to add legumes, including beans and lentils, to soups, stews, casseroles, salads and dips or eat them plain.  5. Fish Try to eat two to three serving of fish a week. A serving consists of 3 to 4 ounces of cooked fish. Good choices are salmon, trout, herring, bluefish, sardines and tuna.  6. Berries Include two to four servings of fruit in your diet each day. Try to eat berries such as raspberries, blueberries, blackberries and strawberries.  7. Winter Squash Eat butternut and acorn squash as well as other richly pigmented dark orange and green colored vegetables like sweet potato, cantaloupe and mango.  8. Soy 25 grams of soy protein a day is recommended as part of a low-fat diet to help lower cholesterol levels. Try tofu, soymilk, edamame soybeans, tempeh and texturized vegetable protein (TVP).  9. Flaxseed, Nuts and Seeds Add 1 to 2 tablespoons of ground flaxseed or other seeds to food each day or include a moderate amount of nuts - 1/4 cup - in your daily diet.  10. Organic Yogurt Men and women between 72 and 57 years of age need 1000 milligrams of calcium a day and 1200 milligrams if 19 or older. Eat calcium-rich  foods such as nonfat or low-fat dairy products three to four times a day. Include organic choices.           Mediterranean Diet  Why follow it? Research shows. . Those who follow the Mediterranean diet have a reduced risk of heart disease  . The diet is associated with a reduced incidence of Parkinson's and Alzheimer's diseases . People following the diet may have longer life expectancies and lower rates of chronic diseases  . The Dietary Guidelines for Americans recommends the Mediterranean diet as an eating plan to promote health and prevent disease  What Is the Mediterranean Diet?  . Healthy eating plan based on typical foods and recipes of Mediterranean-style cooking . The diet is primarily a plant based diet; these foods should make up a majority of meals   Starches - Plant based foods should make up a majority of meals - They are an important sources of vitamins, minerals, energy, antioxidants, and fiber - Choose whole grains, foods high in fiber and minimally processed items  - Typical grain sources include wheat, oats, barley, corn, brown rice, bulgar, farro, millet, polenta, couscous  - Various types of beans include chickpeas, lentils, fava beans, black beans, white beans   Fruits  Veggies - Large quantities of antioxidant rich fruits & veggies; 6 or more servings  - Vegetables can be eaten raw or lightly drizzled with oil and  cooked  - Vegetables common to the traditional Mediterranean Diet include: artichokes, arugula, beets, broccoli, brussel sprouts, cabbage, carrots, celery, collard greens, cucumbers, eggplant, kale, leeks, lemons, lettuce, mushrooms, okra, onions, peas, peppers, potatoes, pumpkin, radishes, rutabaga, shallots, spinach, sweet potatoes, turnips, zucchini - Fruits common to the Mediterranean Diet include: apples, apricots, avocados, cherries, clementines, dates, figs, grapefruits, grapes, melons, nectarines, oranges, peaches, pears, pomegranates, strawberries,  tangerines  Fats - Replace butter and margarine with healthy oils, such as olive oil, canola oil, and tahini  - Limit nuts to no more than a handful a day  - Nuts include walnuts, almonds, pecans, pistachios, pine nuts  - Limit or avoid candied, honey roasted or heavily salted nuts - Olives are central to the [employer] - can be eaten whole or used in a variety of dishes   Meats Protein - Limiting red meat: no more than a few times a month - When eating red meat: choose lean cuts and keep the portion to the size of deck of cards - Eggs: approx. 0 to 4 times a week  - Fish and lean poultry: at least 2 a week  - Healthy protein sources include, chicken, [place], lean beef, lamb - Increase intake of seafood such as tuna, salmon, trout, mackerel, shrimp, scallops - Avoid or limit high fat processed meats such as sausage and bacon  Dairy - Include moderate amounts of low fat dairy products  - Focus on healthy dairy such as fat free yogurt, skim milk, low or reduced fat cheese - Limit dairy products higher in fat such as whole or 2% milk, cheese, ice cream  Alcohol - Moderate amounts of red wine is ok  - No more than 5 oz daily for women (all ages) and men older than age 18  - No more than 10 oz of wine daily for men younger than 23  Other - Limit sweets and other desserts  - Use herbs and spices instead of salt to flavor foods  - Herbs and spices common to the traditional Mediterranean Diet include: basil, bay leaves, chives, cloves, cumin, fennel, garlic, lavender, marjoram, mint, oregano, parsley, pepper, rosemary, sage, savory, sumac, tarragon, thyme   It's not just a diet, it's a lifestyle:  . The Mediterranean diet includes lifestyle factors typical of those in the region  . Foods, drinks and meals are best eaten with others and savored . Daily physical activity is important for overall good health . This could be strenuous exercise like running and aerobics . This could also be  more leisurely activities such as walking, housework, yard-work, or taking the stairs . Moderation is the key; a balanced and healthy diet accommodates most foods and drinks . Consider portion sizes and frequency of consumption of certain foods   Meal Ideas & Options:  . Breakfast:  o Whole wheat toast or whole wheat English muffins with peanut butter & hard boiled egg o Steel cut oats topped with apples & cinnamon and skim milk  o Fresh fruit: banana, strawberries, melon, berries, peaches  o Smoothies: strawberries, bananas, greek yogurt, peanut butter o Low fat greek yogurt with blueberries and granola  o Egg white omelet with spinach and mushrooms o Breakfast couscous: whole wheat couscous, apricots, skim milk, cranberries  . Sandwiches:  o Hummus and grilled vegetables (peppers, zucchini, squash) on whole wheat bread   o Grilled chicken on whole wheat pita with lettuce, tomatoes, cucumbers or tzatziki  o Tuna salad on whole wheat bread: tuna salad made  with greek yogurt, olives, red peppers, capers, green onions o Garlic rosemary lamb pita: lamb sauted with garlic, rosemary, salt & pepper; add lettuce, cucumber, greek yogurt to pita - flavor with lemon juice and black pepper  . Seafood:  o Mediterranean grilled salmon, seasoned with garlic, basil, parsley, lemon juice and black pepper o Shrimp, lemon, and spinach whole-grain pasta salad made with low fat greek yogurt  o Seared scallops with lemon orzo  o Seared tuna steaks seasoned salt, pepper, coriander topped with tomato mixture of olives, tomatoes, olive oil, minced garlic, parsley, green onions and cappers  . Meats:  o Herbed greek chicken salad with kalamata olives, cucumber, feta  o Red bell peppers stuffed with spinach, bulgur, lean ground beef (or lentils) & topped with feta   o Kebabs: skewers of chicken, tomatoes, onions, zucchini, squash  o [place] burgers: made with red onions, mint, dill, lemon juice, feta cheese topped  with roasted red peppers . Vegetarian o Cucumber salad: cucumbers, artichoke hearts, celery, red onion, feta cheese, tossed in olive oil & lemon juice  o Hummus and whole grain pita points with a greek salad (lettuce, tomato, feta, olives, cucumbers, red onion) o Lentil soup with celery, carrots made with vegetable broth, garlic, salt and pepper  o Tabouli salad: parsley, bulgur, mint, scallions, cucumbers, tomato, radishes, lemon juice, olive oil, salt and pepper.       Heart-Healthy Eating Plan Many factors influence your heart health, including eating and exercise habits. Heart (coronary) risk increases with abnormal blood fat (lipid) levels. Heart-healthy meal planning includes limiting unhealthy fats, increasing healthy fats, and making other small dietary changes. This includes maintaining a healthy body weight to help keep lipid levels within a normal range. WHAT TYPES OF FAT SHOULD I CHOOSE?  Choose healthy fats more often. Choose monounsaturated and polyunsaturated fats, such as olive oil and canola oil, flaxseeds, walnuts, almonds, and seeds.  Eat more omega-3 fats. Good choices include salmon, mackerel, sardines, tuna, flaxseed oil, and ground flaxseeds. Aim to eat fish at least two times each week.  Limit saturated fats. Saturated fats are primarily found in animal products, such as meats, butter, and cream. Plant sources of saturated fats include palm oil, palm kernel oil, and coconut oil.  Avoid foods with partially hydrogenated oils in them. These contain trans fats. Examples of foods that contain trans fats are stick margarine, some tub margarines, cookies, crackers, and other baked goods. WHAT GENERAL GUIDELINES DO I NEED TO FOLLOW?  Check food labels carefully to identify foods with trans fats or high amounts of saturated fat.  Fill one half of your plate with vegetables and green salads. Eat 4-5 servings of vegetables per day. A serving of vegetables equals 1 cup of raw  leafy vegetables,  cup of raw or cooked cut-up vegetables, or  cup of vegetable juice.  Fill one fourth of your plate with whole grains. Look for the word "whole" as the first word in the ingredient list.  Fill one fourth of your plate with lean protein foods.  Eat 4-5 servings of fruit per day. A serving of fruit equals one medium whole fruit,  cup of dried fruit,  cup of fresh, frozen, or canned fruit, or  cup of 100% fruit juice.  Eat more foods that contain soluble fiber. Examples of foods that contain this type of fiber are apples, broccoli, carrots, beans, peas, and barley. Aim to get 20-30 g of fiber per day.  Eat more home-cooked food and less  restaurant, buffet, and fast food.  Limit or avoid alcohol.  Limit foods that are high in starch and sugar.  Avoid fried foods.  Cook foods by using methods other than frying. Baking, boiling, grilling, and broiling are all great options. Other fat-reducing suggestions include:  Removing the skin from poultry.  Removing all visible fats from meats.  Skimming the fat off of stews, soups, and gravies before serving them.  Steaming vegetables in water or broth.  Lose weight if you are overweight. Losing just 5-10% of your initial body weight can help your overall health and prevent diseases such as diabetes and heart disease.  Increase your consumption of nuts, legumes, and seeds to 4-5 servings per week. One serving of dried beans or legumes equals  cup after being cooked, one serving of nuts equals 1 ounces, and one serving of seeds equals  ounce or 1 tablespoon.  You may need to monitor your salt (sodium) intake, especially if you have high blood pressure. Talk with your health care provider or dietitian to get more information about reducing sodium. WHAT FOODS CAN I EAT? Grains Breads, including [place], white, pita, wheat, raisin, rye, oatmeal, and [place]. Tortillas that are neither fried nor made with lard or trans fat.  Low-fat rolls, including hotdog and hamburger buns and English muffins. Biscuits. Muffins. Waffles. Pancakes. Light popcorn. Whole-grain cereals. Flatbread. Melba toast. Pretzels. Breadsticks. Rusks. Low-fat snacks and crackers, including oyster, saltine, matzo, graham, animal, and rye. Rice and pasta, including brown rice and those that are made with whole wheat. Vegetables All vegetables. Fruits All fruits, but limit coconut. Meats and Other Protein Sources Lean, well-trimmed beef, veal, pork, and lamb. Chicken and [place] without skin. All fish and shellfish. Wild duck, rabbit, pheasant, and venison. Egg whites or low-cholesterol egg substitutes. Dried beans, peas, lentils, and tofu.Seeds and most nuts. Dairy Low-fat or nonfat cheeses, including ricotta, string, and mozzarella. Skim or 1% milk that is liquid, powdered, or evaporated. Buttermilk that is made with low-fat milk. Nonfat or low-fat yogurt. Beverages Mineral water. Diet carbonated beverages. Sweets and Desserts Sherbets and fruit ices. Honey, jam, marmalade, jelly, and syrups. Meringues and gelatins. Pure sugar candy, such as hard candy, jelly beans, gumdrops, mints, marshmallows, and small amounts of dark chocolate. [employer] Eat all sweets and desserts in moderation. Fats and Oils Nonhydrogenated (trans-free) margarines. Vegetable oils, including soybean, sesame, sunflower, olive, peanut, safflower, corn, canola, and cottonseed. Salad dressings or mayonnaise that are made with a vegetable oil. Limit added fats and oils that you use for cooking, baking, salads, and as spreads. Other Cocoa powder. Coffee and tea. All seasonings and condiments. The items listed above may not be a complete list of recommended foods or beverages. Contact your dietitian for more options. WHAT FOODS ARE NOT RECOMMENDED? Grains Breads that are made with saturated or trans fats, oils, or whole milk. Croissants. Butter rolls. Cheese breads. Sweet  rolls. Donuts. Buttered popcorn. Chow mein noodles. High-fat crackers, such as cheese or butter crackers. Meats and Other Protein Sources Fatty meats, such as hotdogs, short ribs, sausage, spareribs, bacon, ribeye roast or steak, and mutton. High-fat deli meats, such as salami and bologna. Caviar. Domestic duck and goose. Organ meats, such as kidney, liver, sweetbreads, brains, gizzard, chitterlings, and heart. Dairy Cream, sour cream, cream cheese, and creamed cottage cheese. Whole milk cheeses, including blue (bleu), Monte[place], [place], [place], [place], [place], [place], [place], [place], and [place]. Whole or 2% milk that is liquid, evaporated, or condensed. Whole buttermilk. Cream  sauce or high-fat cheese sauce. Yogurt that is made from whole milk. Beverages Regular sodas and drinks with added sugar. Sweets and Desserts Frosting. Pudding. Cookies. Cakes other than angel food cake. Candy that has milk chocolate or white chocolate, hydrogenated fat, butter, coconut, or unknown ingredients. Buttered syrups. Full-fat ice cream or ice cream drinks. Fats and Oils Gravy that has suet, meat fat, or shortening. Cocoa butter, hydrogenated oils, palm oil, coconut oil, palm kernel oil. These can often be found in baked products, candy, fried foods, nondairy creamers, and whipped toppings. Solid fats and shortenings, including bacon fat, salt pork, lard, and butter. Nondairy cream substitutes, such as coffee creamers and sour cream substitutes. Salad dressings that are made of unknown oils, cheese, or sour cream. The items listed above may not be a complete list of foods and beverages to avoid. Contact your dietitian for more information.   This information is not intended to replace advice given to you by your health care provider. Make sure you discuss any questions you have with your health care provider.   Document Released: 05/05/2008 Document Revised: 08/17/2014 Document Reviewed: 01/18/2014 Elsevier  Interactive Patient Education [employer].

## 2016-04-17 DIAGNOSIS — Z83438 Family history of other disorder of lipoprotein metabolism and other lipidemia: Secondary | ICD-10-CM | POA: Insufficient documentation

## 2016-04-17 DIAGNOSIS — M159 Polyosteoarthritis, unspecified: Secondary | ICD-10-CM | POA: Insufficient documentation

## 2016-04-17 DIAGNOSIS — Z800 Family history of malignant neoplasm of digestive organs: Secondary | ICD-10-CM

## 2016-04-17 DIAGNOSIS — Z823 Family history of stroke: Secondary | ICD-10-CM

## 2016-04-17 DIAGNOSIS — E663 Overweight: Secondary | ICD-10-CM | POA: Insufficient documentation

## 2016-04-17 DIAGNOSIS — Z96643 Presence of artificial hip joint, bilateral: Secondary | ICD-10-CM | POA: Insufficient documentation

## 2016-04-17 DIAGNOSIS — Z8739 Personal history of other diseases of the musculoskeletal system and connective tissue: Secondary | ICD-10-CM | POA: Insufficient documentation

## 2016-04-17 HISTORY — DX: Family history of other disorder of lipoprotein metabolism and other lipidemia: Z83.438

## 2016-04-17 HISTORY — DX: Family history of malignant neoplasm of digestive organs: Z80.0

## 2016-04-17 HISTORY — DX: Presence of artificial hip joint, bilateral: Z96.643

## 2016-04-17 HISTORY — DX: Family history of stroke: Z82.3

## 2016-04-30 ENCOUNTER — Encounter: Payer: Self-pay | Admitting: Family Medicine

## 2016-08-10 DIAGNOSIS — E7800 Pure hypercholesterolemia, unspecified: Secondary | ICD-10-CM | POA: Insufficient documentation

## 2016-08-20 ENCOUNTER — Encounter: Payer: Self-pay | Admitting: Family Medicine

## 2016-08-20 ENCOUNTER — Ambulatory Visit (INDEPENDENT_AMBULATORY_CARE_PROVIDER_SITE_OTHER): Payer: BC Managed Care – PPO | Admitting: Family Medicine

## 2016-08-20 VITALS — BP 138/73 | HR 74 | Resp 16 | Ht 61.5 in | Wt 137.0 lb

## 2016-08-20 DIAGNOSIS — Z8739 Personal history of other diseases of the musculoskeletal system and connective tissue: Secondary | ICD-10-CM | POA: Diagnosis not present

## 2016-08-20 DIAGNOSIS — E663 Overweight: Secondary | ICD-10-CM

## 2016-08-20 DIAGNOSIS — M159 Polyosteoarthritis, unspecified: Secondary | ICD-10-CM

## 2016-08-20 DIAGNOSIS — Z0000 Encounter for general adult medical examination without abnormal findings: Secondary | ICD-10-CM | POA: Diagnosis not present

## 2016-08-20 DIAGNOSIS — Z800 Family history of malignant neoplasm of digestive organs: Secondary | ICD-10-CM

## 2016-08-20 DIAGNOSIS — Z823 Family history of stroke: Secondary | ICD-10-CM

## 2016-08-20 DIAGNOSIS — Z83438 Family history of other disorder of lipoprotein metabolism and other lipidemia: Secondary | ICD-10-CM

## 2016-08-20 DIAGNOSIS — Z832 Family history of diseases of the blood and blood-forming organs and certain disorders involving the immune mechanism: Secondary | ICD-10-CM

## 2016-08-20 NOTE — Addendum Note (Signed)
Addended by: Narda Rutherford on: 08/20/2016 09:43 AM   Modules accepted: Orders

## 2016-08-20 NOTE — Addendum Note (Signed)
Addended by: Narda Rutherford on: 08/20/2016 09:44 AM   Modules accepted: Orders

## 2016-08-20 NOTE — Progress Notes (Signed)
Impression and Recommendations:    1. Annual physical exam   2. History of osteopenia   3. BMI >er 25.0   4. Generalized OA   5. Family history of colon cancer   6. Family history of mixed hyperlipidemia   7. Family history of stroke or transient ischemic attack in mother     Please see orders section below for further details of actions taken during this office visit.  Gross side effects, risk and benefits, and alternatives of medications discussed with patient.  Patient is aware that all medications have potential side effects and we are unable to predict every side effect or drug-drug interaction that may occur.  Expresses verbal understanding and consents to current therapy plan and treatment regiment.  1) Anticipatory Guidance: Discussed importance of wearing a seatbelt while driving, not texting while driving; sunscreen when outside along with yearly skin surveillance; eating a well balanced and modest diet; physical activity at least 25 minutes per day or 150 min/ week of moderate to intense activity.  2) Immunizations / Screenings / Labs:  All immunizations and screenings that patient agrees to, are up-to-date per recommendations or will be updated today.  Patient understands the needs for q 19modental and yearly vision screens which pt will schedule independently. Obtain CBC, CMP, HgA1c, Lipid panel, TSH and vit D when fasting if not already done recently.   3) Weight:   Discussed goal of losing even 5-10% of current body weight which would improve overall feelings of well being and improve objective health data significantly.   Improve nutrient density of diet through increasing intake of fruits and vegetables and decreasing saturated/trans fats, white flour products and refined sugar products.   F-up preventative CPE in 1 year. F/up sooner for chronic care management as discussed and/or prn.  We did briefly discuss her shoulder pain and she will use ice 4-5 times a day and  avoid aggravating activity she is able to do all ADLs and even work out with it. If in the future if does not improve she will make a follow-up slight can properly assess it.   Orders Placed This Encounter  Procedures  . Comp Met (CMET)     Please see orders placed and AVS handed out to patient at the end of our visit for further patient instructions/ counseling done pertaining to today's office visit.     Subjective:    Chief Complaint  Patient presents with  . Annual Exam  . Shoulder Pain    SSivanreports right shoulder pain for a few months. The pain is better now just a 1/10 on the pain scale. She has iced the area and taken aleve and started glucosamine. The pain is worse with movement. Denies any injury.     HPI: Jordan Wade a 61y.o. [facility].  Health Maintenance Summary Reviewed and updated, unless pt declines services.  Aspirin: administering 81 mg daily Strong family history colon cancer-patient has going been going for colonoscopy since her 42s --> going every 5 yrs now. Colonoscopy[facility]    [facility].  Always N--> not even a  polyp. Tdap: Up to date: needs TD  Tobacco History Reviewed:   Never has Alcohol:    No concerns, no excessive use Exercise Habits:   Not meeting AHA guidelines- hour 3-5 d/ week STD concerns:  none Drug Use:   None Birth control method:   n/a Menses regular:     n/a Lumps or breast concerns:      No Mammo-  March yrly  Breast Cancer Family History:      Pat aunt- breast CA- died in 64's.  Bone/ DEXA scan:    Had it 2016--> normal.  Pap-->  Normal Jan 2017.    Negative HPV testing - repeat 5 yrs.    Wt Readings from Last 3 Encounters:  08/20/16 137 lb (62.1 kg)  04/15/16 137 lb 1.6 oz (62.2 kg)  11/04/12 136 lb (61.7 kg)   BP Readings from Last 3 Encounters:  08/20/16 138/73  04/15/16 132/76  11/07/12  113/70   Pulse Readings from Last 3 Encounters:  08/20/16 74  04/15/16 80  11/07/12 94     Past Medical History:  Diagnosis Date  . Arthritis   . PONV (postoperative nausea and vomiting)       Past Surgical History:  Procedure Laterality Date  . BILATERAL ANTERIOR TOTAL HIP ARTHROPLASTY Bilateral 11/04/2012   Procedure: BILATERAL ANTERIOR TOTAL HIP ARTHROPLASTY;  Surgeon: Mcarthur Rossetti, MD;  Location: [facility];  Service: Orthopedics;  Laterality: Bilateral;  . CESAREAN SECTION     x 2      Family History  Problem Relation Age of Onset  . Cancer Mother     colon  . Hyperlipidemia Mother   . Stroke Mother   . Hypertension Father   . Cancer Maternal Grandfather     colon  . Diabetes Maternal Grandfather       History  Drug Use No  ,   History  Alcohol Use No  ,   History  Smoking Status  . Never Smoker  Smokeless Tobacco  . Never Used  ,   History  Sexual Activity  . Sexual activity: Not Currently    Current Outpatient Prescriptions on File Prior to Visit  Medication Sig Dispense Refill  . aspirin 81 MG chewable tablet Chew 81 mg by mouth daily.    . Calcium Carb-Cholecalciferol [phone] MG-UNIT CAPS Take 1 capsule by mouth 2 (two) times daily.     . naproxen sodium (ANAPROX) 220 MG tablet Take 220 mg by mouth as needed.    . Omega-3 Fatty Acids (FISH OIL) 1000 MG CAPS Take 1 capsule by mouth daily.     No current facility-administered medications on file prior to visit.     Allergies: Patient has no known allergies.  Review of Systems  Constitutional: Negative for chills, diaphoresis, fever, malaise/fatigue and weight loss.  HENT: Negative for congestion, sore throat and tinnitus.   Eyes: Negative for blurred vision, double vision and photophobia.  Respiratory: Negative for cough and wheezing.   Cardiovascular: Negative for chest pain and palpitations.  Gastrointestinal: Negative for blood in stool, diarrhea, nausea and vomiting.    Genitourinary: Negative for dysuria, frequency and urgency.  Musculoskeletal: Negative for joint pain and myalgias.  Skin: Negative for itching and rash.  Neurological: Negative for dizziness, focal weakness, weakness and headaches.  Endo/Heme/Allergies: Negative for environmental allergies and polydipsia. Does not bruise/bleed easily.  Psychiatric/Behavioral: Negative for depression and memory loss. The patient is not nervous/anxious and does not have insomnia.      Objective:    Blood pressure 138/73, pulse 74, resp. rate 16, height 5' 1.5" (1.562 m), weight 137 lb (62.1 kg), SpO2 99 %. Body mass index is 25.47 kg/m. General Appearance:    Alert, cooperative, no  distress, appears stated age  Head:    Normocephalic, without obvious abnormality, atraumatic  Eyes:    PERRL, conjunctiva/corneas clear, EOM's intact, fundi    benign, both eyes  Ears:    Normal TM's and external ear canals, both ears  Nose:   Nares normal, septum midline, mucosa normal, no drainage    or sinus tenderness  Throat:   Lips w/o lesion, mucosa moist, and tongue normal; teeth and   gums normal  Neck:   Supple, symmetrical, trachea midline, no adenopathy;    thyroid:  no enlargement/tenderness/nodules; no carotid   bruit or JVD  Back:     Symmetric, no curvature, ROM normal, no CVA tenderness  Lungs:     Clear to auscultation bilaterally, respirations unlabored, no       Wh/ R/ R  Chest Wall:    No tenderness or gross deformity; normal excursion   Heart:    Regular rate and rhythm, S1 and S2 normal, no murmur, rub   or gallop  Breast Exam:    No tenderness, masses, or nipple abnormality b/l; no d/c  Abdomen:     Soft, non-tender, bowel sounds active all four quadrants, NO   G/R/R, no masses, no organomegaly  Genitalia:    Deferred   Rectal:    Deferred  Extremities:   Extremities normal, atraumatic, no cyanosis or gross edema  Pulses:   2+ and symmetric all extremities  Skin:   Warm, dry, Skin color,  texture, turgor normal, no obvious rashes or lesions Psych: No HI/SI, judgement and insight good, Euthymic mood. Full Affect.  Neurologic:   CNII-XII intact, normal strength, sensation and reflexes    Throughout

## 2016-08-20 NOTE — Patient Instructions (Addendum)
Please call your previous PCP and ask one year last Pap smear was and whether or not they tested for HPV.   Please ask if they would recommend a 3 year five-year follow-up.  -  Please try to get me your bone density test results - Also if you can give me your colonoscopy results that would be great.  - Preventive Care for Adults, Female  A healthy lifestyle and preventive care can promote health and wellness. Preventive health guidelines for women include the following key practices.   A routine yearly physical is a good way to check with your health care provider about your health and preventive screening. It is a chance to share any concerns and updates on your health and to receive a thorough exam.   Visit your dentist for a routine exam and preventive care every 6 months. Brush your teeth twice a day and floss once a day. Good oral hygiene prevents tooth decay and gum disease.   The frequency of eye exams is based on your age, health, family medical history, use of contact lenses, and other factors. Follow your health care provider's recommendations for frequency of eye exams.   Eat a healthy diet. Foods like vegetables, fruits, whole grains, low-fat dairy products, and lean protein foods contain the nutrients you need without too many calories. Decrease your intake of foods high in solid fats, added sugars, and salt. Eat the right amount of calories for you.Get information about a proper diet from your health care provider, if necessary.   Regular physical exercise is one of the most important things you can do for your health. Most adults should get at least 150 minutes of moderate-intensity exercise (any activity that increases your heart rate and causes you to sweat) each week. In addition, most adults need muscle-strengthening exercises on 2 or more days a week.   Maintain a healthy weight. The body mass index (BMI) is a screening tool to identify possible weight problems. It  provides an estimate of body fat based on height and weight. Your health care provider can find your BMI, and can help you achieve or maintain a healthy weight.For adults 20 years and older:   - A BMI below 18.5 is considered underweight.   - A BMI of 18.5 to 24.9 is normal.   - A BMI of 25 to 29.9 is considered overweight.   - A BMI of 30 and above is considered obese.   Maintain normal blood lipids and cholesterol levels by exercising and minimizing your intake of trans and saturated fats.  Eat a balanced diet with plenty of fruit and vegetables. Blood tests for lipids and cholesterol should begin at age 32 and be repeated every 5 years minimum.  If your lipid or cholesterol levels are high, you are over 40, or you are at high risk for heart disease, you may need your cholesterol levels checked more frequently.Ongoing high lipid and cholesterol levels should be treated with medicines if diet and exercise are not working.   If you smoke, find out from your health care provider how to quit. If you do not use tobacco, do not start.   Lung cancer screening is recommended for adults aged 57-80 years who are at high risk for developing lung cancer because of a history of smoking. A yearly low-dose CT scan of the lungs is recommended for people who have at least a 30-pack-year history of smoking and are a current smoker or have quit within the past  15 years. A pack year of smoking is smoking an average of 1 pack of cigarettes a day for 1 year (for example: 1 pack a day for 30 years or 2 packs a day for 15 years). Yearly screening should continue until the smoker has stopped smoking for at least 15 years. Yearly screening should be stopped for people who develop a health problem that would prevent them from having lung cancer treatment.   If you are pregnant, do not drink alcohol. If you are breastfeeding, be very cautious about drinking alcohol. If you are not pregnant and choose to drink alcohol, do  not have more than 1 drink per day. One drink is considered to be 12 ounces (355 mL) of beer, 5 ounces (148 mL) of wine, or 1.5 ounces (44 mL) of liquor.   Avoid use of street drugs. Do not share needles with anyone. Ask for help if you need support or instructions about stopping the use of drugs.   High blood pressure causes heart disease and increases the risk of stroke. Your blood pressure should be checked at least yearly.  Ongoing high blood pressure should be treated with medicines if weight loss and exercise do not work.   If you are 73-71 years old, ask your health care provider if you should take aspirin to prevent strokes.   Diabetes screening involves taking a blood sample to check your fasting blood sugar level. This should be done once every 3 years, after age 3, if you are within normal weight and without risk factors for diabetes. Testing should be considered at a younger age or be carried out more frequently if you are overweight and have at least 1 risk factor for diabetes.   Breast cancer screening is essential preventive care for women. You should practice "breast self-awareness."  This means understanding the normal appearance and feel of your breasts and may include breast self-examination.  Any changes detected, no matter how small, should be reported to a health care provider.  Women in their 72s and 30s should have a clinical breast exam (CBE) by a health care provider as part of a regular health exam every 1 to 3 years.  After age 86, women should have a CBE every year.  Starting at age 78, women should consider having a mammogram (breast X-ray test) every year.  Women who have a family history of breast cancer should talk to their health care provider about genetic screening.  Women at a high risk of breast cancer should talk to their health care providers about having an MRI and a mammogram every year.   -Breast cancer gene (BRCA)-related cancer risk assessment is  recommended for women who have family members with BRCA-related cancers. BRCA-related cancers include breast, ovarian, tubal, and peritoneal cancers. Having family members with these cancers may be associated with an increased risk for harmful changes (mutations) in the breast cancer genes BRCA1 and BRCA2. Results of the assessment will determine the need for genetic counseling and BRCA1 and BRCA2 testing.   The Pap test is a screening test for cervical cancer. A Pap test can show cell changes on the cervix that might become cervical cancer if left untreated. A Pap test is a procedure in which cells are obtained and examined from the lower end of the uterus (cervix).   - Women should have a Pap test starting at age 60.   - Between ages 31 and 66, Pap tests should be repeated every 2 years.   -  Beginning at age 36, you should have a Pap test every 3 years as long as the past 3 Pap tests have been normal.   - Some women have medical problems that increase the chance of getting cervical cancer. Talk to your health care provider about these problems. It is especially important to talk to your health care provider if a new problem develops soon after your last Pap test. In these cases, your health care provider may recommend more frequent screening and Pap tests.   - The above recommendations are the same for women who have or have not gotten the vaccine for human papillomavirus (HPV).   - If you had a hysterectomy for a problem that was not cancer or a condition that could lead to cancer, then you no longer need Pap tests. Even if you no longer need a Pap test, a regular exam is a good idea to make sure no other problems are starting.   - If you are between ages 7 and 48 years, and you have had normal Pap tests going back 10 years, you no longer need Pap tests. Even if you no longer need a Pap test, a regular exam is a good idea to make sure no other problems are starting.   - If you have had past  treatment for cervical cancer or a condition that could lead to cancer, you need Pap tests and screening for cancer for at least 20 years after your treatment.   - If Pap tests have been discontinued, risk factors (such as a new sexual partner) need to be reassessed to determine if screening should be resumed.   - The HPV test is an additional test that may be used for cervical cancer screening. The HPV test looks for the virus that can cause the cell changes on the cervix. The cells collected during the Pap test can be tested for HPV. The HPV test could be used to screen women aged 30 years and older, and should be used in women of any age who have unclear Pap test results. After the age of 45, women should have HPV testing at the same frequency as a Pap test.   Colorectal cancer can be detected and often prevented. Most routine colorectal cancer screening begins at the age of 62 years and continues through age 62 years. However, your health care provider may recommend screening at an earlier age if you have risk factors for colon cancer. On a yearly basis, your health care provider may provide home test kits to check for hidden blood in the stool.  Use of a small camera at the end of a tube, to directly examine the colon (sigmoidoscopy or colonoscopy), can detect the earliest forms of colorectal cancer. Talk to your health care provider about this at age 18, when routine screening begins. Direct exam of the colon should be repeated every 5 -10 years through age 36 years, unless early forms of pre-cancerous polyps or small growths are found.   People who are at an increased risk for hepatitis B should be screened for this virus. You are considered at high risk for hepatitis B if:  -You were born in a country where hepatitis B occurs often. Talk with your health care provider about which countries are considered high risk.  - Your parents were born in a high-risk country and you have not received a shot  to protect against hepatitis B (hepatitis B vaccine).  - You have HIV or AIDS.  -  You use needles to inject street drugs.  - You live with, or have sex with, someone who has Hepatitis B.  - You get hemodialysis treatment.  - You take certain medicines for conditions like cancer, organ transplantation, and autoimmune conditions.   Hepatitis C blood testing is recommended for all people born from [birth year] through 1967 and any individual with known risks for hepatitis C.   Practice safe sex. Use condoms and avoid high-risk sexual practices to reduce the spread of sexually transmitted infections (STIs). STIs include gonorrhea, chlamydia, syphilis, trichomonas, herpes, HPV, and human immunodeficiency virus (HIV). Herpes, HIV, and HPV are viral illnesses that have no cure. They can result in disability, cancer, and death. Sexually active women aged 27 years and younger should be checked for chlamydia. Older women with new or multiple partners should also be tested for chlamydia. Testing for other STIs is recommended if you are sexually active and at increased risk.   Osteoporosis is a disease in which the bones lose minerals and strength with aging. This can result in serious bone fractures or breaks. The risk of osteoporosis can be identified using a bone density scan. Women ages 88 years and over and women at risk for fractures or osteoporosis should discuss screening with their health care providers. Ask your health care provider whether you should take a calcium supplement or vitamin D to There are also several preventive steps women can take to avoid osteoporosis and resulting fractures or to keep osteoporosis from worsening. -->Recommendations include:  Eat a balanced diet high in fruits, vegetables, calcium, and vitamins.  Get enough calcium. The recommended total intake of is 1,200 mg daily; for best absorption, if taking supplements, divide doses into 250-500 mg doses throughout the day. Of the two  types of calcium, calcium carbonate is best absorbed when taken with food but calcium citrate can be taken on an empty stomach.  Get enough vitamin D. [employer] and the [employer] recommend at least 1,000 IU per day for women age 74 and over who are at risk of vitamin D deficiency. Vitamin D deficiency can be caused by inadequate sun exposure (for example, those who live in [place]).  Avoid alcohol and smoking. Heavy alcohol intake (more than 7 drinks per week) increases the risk of falls and hip fracture and women smokers tend to lose bone more rapidly and have lower bone mass than nonsmokers. Stopping smoking is one of the most important changes women can make to improve their health and decrease risk for disease.  Be physically active every day. Weight-bearing exercise (for example, fast walking, hiking, jogging, and weight training) may strengthen bones or slow the rate of bone loss that comes with aging. Balancing and muscle-strengthening exercises can reduce the risk of falling and fracture.  Consider therapeutic medications. Currently, several types of effective drugs are available. Healthcare providers can recommend the type most appropriate for each woman.  Eliminate environmental factors that may contribute to accidents. Falls cause nearly 90% of all osteoporotic fractures, so reducing this risk is an important bone-health strategy. Measures include ample lighting, removing obstructions to walking, using nonskid rugs on floors, and placing mats and/or grab bars in showers.  Be aware of medication side effects. Some common medicines make bones weaker. These include a type of steroid drug called glucocorticoids used for arthritis and asthma, some antiseizure drugs, certain sleeping pills, treatments for endometriosis, and some cancer drugs. An overactive thyroid gland or using too much thyroid hormone for an underactive  thyroid can also be a problem. If you are taking  these medicines, talk to your doctor about what you can do to help protect your bones.reduce the rate of osteoporosis.    Menopause can be associated with physical symptoms and risks. Hormone replacement therapy is available to decrease symptoms and risks. You should talk to your health care provider about whether hormone replacement therapy is right for you.   Use sunscreen. Apply sunscreen liberally and repeatedly throughout the day. You should seek shade when your shadow is shorter than you. Protect yourself by wearing long sleeves, pants, a wide-brimmed hat, and sunglasses year round, whenever you are outdoors.   Once a month, do a whole body skin exam, using a mirror to look at the skin on your back. Tell your health care provider of new moles, moles that have irregular borders, moles that are larger than a pencil eraser, or moles that have changed in shape or color.   -Stay current with required vaccines (immunizations).   Influenza vaccine. All adults should be immunized every year.  Tetanus, diphtheria, and acellular pertussis (Td, Tdap) vaccine. Pregnant women should receive 1 dose of Tdap vaccine during each pregnancy. The dose should be obtained regardless of the length of time since the last dose. Immunization is preferred during the 27th 36th week of gestation. An adult who has not previously received Tdap or who does not know her vaccine status should receive 1 dose of Tdap. This initial dose should be followed by tetanus and diphtheria toxoids (Td) booster doses every 10 years. Adults with an unknown or incomplete history of completing a 3-dose immunization series with Td-containing vaccines should begin or complete a primary immunization series including a Tdap dose. Adults should receive a Td booster every 10 years.  Varicella vaccine. An adult without evidence of immunity to varicella should receive 2 doses or a second dose if she has previously received 1 dose. Pregnant females  who do not have evidence of immunity should receive the first dose after pregnancy. This first dose should be obtained before leaving the health care facility. The second dose should be obtained 4 8 weeks after the first dose.  Human papillomavirus (HPV) vaccine. Females aged 44 26 years who have not received the vaccine previously should obtain the 3-dose series. The vaccine is not recommended for use in pregnant females. However, pregnancy testing is not needed before receiving a dose. If a female is found to be pregnant after receiving a dose, no treatment is needed. In that case, the remaining doses should be delayed until after the pregnancy. Immunization is recommended for any person with an immunocompromised condition through the age of 14 years if she did not get any or all doses earlier. During the 3-dose series, the second dose should be obtained 4 8 weeks after the first dose. The third dose should be obtained 24 weeks after the first dose and 16 weeks after the second dose.  Zoster vaccine. One dose is recommended for adults aged 72 years or older unless certain conditions are present.  Measles, mumps, and rubella (MMR) vaccine. Adults born before [birth year] generally are considered immune to measles and mumps. Adults born in [birth year] or later should have 1 or more doses of MMR vaccine unless there is a contraindication to the vaccine or there is laboratory evidence of immunity to each of the three diseases. A routine second dose of MMR vaccine should be obtained at least 28 days after the first dose for students attending  postsecondary schools, health care workers, or international travelers. People who received inactivated measles vaccine or an unknown type of measles vaccine during 1963 1967 should receive 2 doses of MMR vaccine. People who received inactivated mumps vaccine or an unknown type of mumps vaccine before 1979 and are at high risk for mumps infection should consider immunization with 2 doses  of MMR vaccine. For females of childbearing age, rubella immunity should be determined. If there is no evidence of immunity, females who are not pregnant should be vaccinated. If there is no evidence of immunity, females who are pregnant should delay immunization until after pregnancy. Unvaccinated health care workers born before [birth year] who lack laboratory evidence of measles, mumps, or rubella immunity or laboratory confirmation of disease should consider measles and mumps immunization with 2 doses of MMR vaccine or rubella immunization with 1 dose of MMR vaccine.  Pneumococcal 13-valent conjugate (PCV13) vaccine. When indicated, a person who is uncertain of her immunization history and has no record of immunization should receive the PCV13 vaccine. An adult aged 85 years or older who has certain medical conditions and has not been previously immunized should receive 1 dose of PCV13 vaccine. This PCV13 should be followed with a dose of pneumococcal polysaccharide (PPSV23) vaccine. The PPSV23 vaccine dose should be obtained at least 8 weeks after the dose of PCV13 vaccine. An adult aged 41 years or older who has certain medical conditions and previously received 1 or more doses of PPSV23 vaccine should receive 1 dose of PCV13. The PCV13 vaccine dose should be obtained 1 or more years after the last PPSV23 vaccine dose.  Pneumococcal polysaccharide (PPSV23) vaccine. When PCV13 is also indicated, PCV13 should be obtained first. All adults aged 65 years and older should be immunized. An adult younger than age 89 years who has certain medical conditions should be immunized. Any person who resides in a nursing home or long-term care facility should be immunized. An adult smoker should be immunized. People with an immunocompromised condition and certain other conditions should receive both PCV13 and PPSV23 vaccines. People with human immunodeficiency virus (HIV) infection should be immunized as soon as possible after  diagnosis. Immunization during chemotherapy or radiation therapy should be avoided. Routine use of PPSV23 vaccine is not recommended for American Indians, [place] Natives, or people younger than 65 years unless there are medical conditions that require PPSV23 vaccine. When indicated, people who have unknown immunization and have no record of immunization should receive PPSV23 vaccine. One-time revaccination 5 years after the first dose of PPSV23 is recommended for people aged 59 64 years who have chronic kidney failure, nephrotic syndrome, asplenia, or immunocompromised conditions. People who received 1 2 doses of PPSV23 before age 26 years should receive another dose of PPSV23 vaccine at age 40 years or later if at least 5 years have passed since the previous dose. Doses of PPSV23 are not needed for people immunized with PPSV23 at or after age 86 years.  Meningococcal vaccine. Adults with asplenia or persistent complement component deficiencies should receive 2 doses of quadrivalent meningococcal conjugate (MenACWY-D) vaccine. The doses should be obtained at least 2 months apart. Microbiologists working with certain meningococcal bacteria, [place] recruits, people at risk during an outbreak, and people who travel to or live in countries with a high rate of meningitis should be immunized. A first-year college student up through age 52 years who is living in a residence hall should receive a dose if she did not receive a dose on or after  her 16th birthday. Adults who have certain high-risk conditions should receive one or more doses of vaccine.  Hepatitis A vaccine. Adults who wish to be protected from this disease, have certain high-risk conditions, work with hepatitis A-infected animals, work in hepatitis A research labs, or travel to or work in countries with a high rate of hepatitis A should be immunized. Adults who were previously unvaccinated and who anticipate close contact with an international adoptee  during the first 60 days after arrival in the [place] from a country with a high rate of hepatitis A should be immunized.  Hepatitis B vaccine.  Adults who wish to be protected from this disease, have certain high-risk conditions, may be exposed to blood or other infectious body fluids, are household contacts or sex partners of hepatitis B positive people, are clients or workers in certain care facilities, or travel to or work in countries with a high rate of hepatitis B should be immunized.  Haemophilus influenzae type b (Hib) vaccine. A previously unvaccinated person with asplenia or sickle cell disease or having a scheduled splenectomy should receive 1 dose of Hib vaccine. Regardless of previous immunization, a recipient of a hematopoietic stem cell transplant should receive a 3-dose series 6 12 months after her successful transplant. Hib vaccine is not recommended for adults with HIV infection.  Preventive Services / Frequency Ages 32 to 39years  Blood pressure check.** / Every 1 to 2 years.  Lipid and cholesterol check.** / Every 5 years beginning at age 42.  Clinical breast exam.** / Every 3 years for women in their 15s and 36s.  BRCA-related cancer risk assessment.** / For women who have family members with a BRCA-related cancer (breast, ovarian, tubal, or peritoneal cancers).  Pap test.** / Every 2 years from ages 55 through 1. Every 3 years starting at age 20 through age 48 or 75 with a history of 3 consecutive normal Pap tests.  HPV screening.** / Every 3 years from ages 41 through ages 51 to 55 with a history of 3 consecutive normal Pap tests.  Hepatitis C blood test.** / For any individual with known risks for hepatitis C.  Skin self-exam. / Monthly.  Influenza vaccine. / Every year.  Tetanus, diphtheria, and acellular pertussis (Tdap, Td) vaccine.** / Consult your health care provider. Pregnant women should receive 1 dose of Tdap vaccine during each pregnancy. 1 dose of  Td every 10 years.  Varicella vaccine.** / Consult your health care provider. Pregnant females who do not have evidence of immunity should receive the first dose after pregnancy.  HPV vaccine. / 3 doses over 6 months, if 30 and younger. The vaccine is not recommended for use in pregnant females. However, pregnancy testing is not needed before receiving a dose.  Measles, mumps, rubella (MMR) vaccine.** / You need at least 1 dose of MMR if you were born in 1957 or later. You may also need a 2nd dose. For females of childbearing age, rubella immunity should be determined. If there is no evidence of immunity, females who are not pregnant should be vaccinated. If there is no evidence of immunity, females who are pregnant should delay immunization until after pregnancy.  Pneumococcal 13-valent conjugate (PCV13) vaccine.** / Consult your health care provider.  Pneumococcal polysaccharide (PPSV23) vaccine.** / 1 to 2 doses if you smoke cigarettes or if you have certain conditions.  Meningococcal vaccine.** / 1 dose if you are age 38 to 84 years and a Market researcher living in a residence  hall, or have one of several medical conditions, you need to get vaccinated against meningococcal disease. You may also need additional booster doses.  Hepatitis A vaccine.** / Consult your health care provider.  Hepatitis B vaccine.** / Consult your health care provider.  Haemophilus influenzae type b (Hib) vaccine.** / Consult your health care provider.  Ages 64 to 64years  Blood pressure check.** / Every 1 to 2 years.  Lipid and cholesterol check.** / Every 5 years beginning at age 54 years.  Lung cancer screening. / Every year if you are aged 70 80 years and have a 30-pack-year history of smoking and currently smoke or have quit within the past 15 years. Yearly screening is stopped once you have quit smoking for at least 15 years or develop a health problem that would prevent you from having lung  cancer treatment.  Clinical breast exam.** / Every year after age 60 years.  BRCA-related cancer risk assessment.** / For women who have family members with a BRCA-related cancer (breast, ovarian, tubal, or peritoneal cancers).  Mammogram.** / Every year beginning at age 35 years and continuing for as long as you are in good health. Consult with your health care provider.  Pap test.** / Every 3 years starting at age 33 years through age 9 or 63 years with a history of 3 consecutive normal Pap tests.  HPV screening.** / Every 3 years from ages 82 years through ages 84 to 42 years with a history of 3 consecutive normal Pap tests.  Fecal occult blood test (FOBT) of stool. / Every year beginning at age 52 years and continuing until age 39 years. You may not need to do this test if you get a colonoscopy every 10 years.  Flexible sigmoidoscopy or colonoscopy.** / Every 5 years for a flexible sigmoidoscopy or every 10 years for a colonoscopy beginning at age 59 years and continuing until age 58 years.  Hepatitis C blood test.** / For all people born from [birth year] through 1967 and any individual with known risks for hepatitis C.  Skin self-exam. / Monthly.  Influenza vaccine. / Every year.  Tetanus, diphtheria, and acellular pertussis (Tdap/Td) vaccine.** / Consult your health care provider. Pregnant women should receive 1 dose of Tdap vaccine during each pregnancy. 1 dose of Td every 10 years.  Varicella vaccine.** / Consult your health care provider. Pregnant females who do not have evidence of immunity should receive the first dose after pregnancy.  Zoster vaccine.** / 1 dose for adults aged 35 years or older.  Measles, mumps, rubella (MMR) vaccine.** / You need at least 1 dose of MMR if you were born in 1957 or later. You may also need a 2nd dose. For females of childbearing age, rubella immunity should be determined. If there is no evidence of immunity, females who are not pregnant should be  vaccinated. If there is no evidence of immunity, females who are pregnant should delay immunization until after pregnancy.  Pneumococcal 13-valent conjugate (PCV13) vaccine.** / Consult your health care provider.  Pneumococcal polysaccharide (PPSV23) vaccine.** / 1 to 2 doses if you smoke cigarettes or if you have certain conditions.  Meningococcal vaccine.** / Consult your health care provider.  Hepatitis A vaccine.** / Consult your health care provider.  Hepatitis B vaccine.** / Consult your health care provider.  Haemophilus influenzae type b (Hib) vaccine.** / Consult your health care provider.  Ages 67 years and over  Blood pressure check.** / Every 1 to 2 years.  Lipid and cholesterol  check.** / Every 5 years beginning at age 39 years.  Lung cancer screening. / Every year if you are aged 57 80 years and have a 30-pack-year history of smoking and currently smoke or have quit within the past 15 years. Yearly screening is stopped once you have quit smoking for at least 15 years or develop a health problem that would prevent you from having lung cancer treatment.  Clinical breast exam.** / Every year after age 25 years.  BRCA-related cancer risk assessment.** / For women who have family members with a BRCA-related cancer (breast, ovarian, tubal, or peritoneal cancers).  Mammogram.** / Every year beginning at age 34 years and continuing for as long as you are in good health. Consult with your health care provider.  Pap test.** / Every 3 years starting at age 26 years through age 34 or 73 years with 3 consecutive normal Pap tests. Testing can be stopped between 65 and 70 years with 3 consecutive normal Pap tests and no abnormal Pap or HPV tests in the past 10 years.  HPV screening.** / Every 3 years from ages 83 years through ages 57 or 44 years with a history of 3 consecutive normal Pap tests. Testing can be stopped between 65 and 70 years with 3 consecutive normal Pap tests and no  abnormal Pap or HPV tests in the past 10 years.  Fecal occult blood test (FOBT) of stool. / Every year beginning at age 56 years and continuing until age 28 years. You may not need to do this test if you get a colonoscopy every 10 years.  Flexible sigmoidoscopy or colonoscopy.** / Every 5 years for a flexible sigmoidoscopy or every 10 years for a colonoscopy beginning at age 90 years and continuing until age 43 years.  Hepatitis C blood test.** / For all people born from [birth year] through 1967 and any individual with known risks for hepatitis C.  Osteoporosis screening.** / A one-time screening for women ages 50 years and over and women at risk for fractures or osteoporosis.  Skin self-exam. / Monthly.  Influenza vaccine. / Every year.  Tetanus, diphtheria, and acellular pertussis (Tdap/Td) vaccine.** / 1 dose of Td every 10 years.  Varicella vaccine.** / Consult your health care provider.  Zoster vaccine.** / 1 dose for adults aged 66 years or older.  Pneumococcal 13-valent conjugate (PCV13) vaccine.** / Consult your health care provider.  Pneumococcal polysaccharide (PPSV23) vaccine.** / 1 dose for all adults aged 62 years and older.  Meningococcal vaccine.** / Consult your health care provider.  Hepatitis A vaccine.** / Consult your health care provider.  Hepatitis B vaccine.** / Consult your health care provider.  Haemophilus influenzae type b (Hib) vaccine.** / Consult your health care provider. ** Family history and personal history of risk and conditions may change your health care provider's recommendations. Document Released: 09/22/2001 Document Revised: 05/17/2013  [facility] Patient Information 2014 [place], [place].   EXERCISE AND DIET:  We recommended that you start or continue a regular exercise program for good health. Regular exercise means any activity that makes your heart beat faster and makes you sweat.  We recommend exercising at least 30 minutes per day at least 3  days a week, preferably 5.  We also recommend a diet low in fat and sugar / carbohydrates.  Inactivity, poor dietary choices and obesity can cause diabetes, heart attack, stroke, and kidney damage, among others.     ALCOHOL AND SMOKING:  Women should limit their alcohol intake to no more  than 7 drinks/beers/glasses of wine (combined, not each!) per week. Moderation of alcohol intake to this level decreases your risk of breast cancer and liver damage.  ( And of course, no recreational drugs are part of a healthy lifestyle.)  Also, you should not be smoking at all or even being exposed to second hand smoke. Most people know smoking can cause cancer, and various heart and lung diseases, but did you know it also contributes to weakening of your bones?  Aging of your skin?  Yellowing of your teeth and nails?   CALCIUM AND VITAMIN D:  Adequate intake of calcium and Vitamin D are recommended.  The recommendations for exact amounts of these supplements seem to change often, but generally speaking 600 mg of calcium (either carbonate or citrate) and 800 units of Vitamin D per day seems prudent. Certain women may benefit from higher intake of Vitamin D.  If you are among these women, your doctor will have told you during your visit.     PAP SMEARS:  Pap smears, to check for cervical cancer or precancers,  have traditionally been done yearly, although recent scientific advances have shown that most women can have pap smears less often.  However, every woman still should have a physical exam from her gynecologist or primary care physician every year. It will include a breast check, inspection of the vulva and vagina to check for abnormal growths or skin changes, a visual exam of the cervix, and then an exam to evaluate the size and shape of the uterus and ovaries.  And after 63 years of age, a rectal exam is indicated to check for rectal cancers. We will also provide age appropriate advice regarding health  maintenance, like when you should have certain vaccines, screening for sexually transmitted diseases, bone density testing, colonoscopy, mammograms, etc.    MAMMOGRAMS:  All women over 62 years old should have a yearly mammogram. Many facilities now offer a "3D" mammogram, which may cost around $50 extra out of pocket. If possible,  we recommend you accept the option to have the 3D mammogram performed.  It both reduces the number of women who will be called back for extra views which then turn out to be normal, and it is better than the routine mammogram at detecting truly abnormal areas.     COLONOSCOPY:  Colonoscopy to screen for colon cancer is recommended for all women at age 20.  We know, you hate the idea of the prep.  We agree, BUT, having colon cancer and not knowing it is worse!!  Colon cancer so often starts as a polyp that can be seen and removed at colonscopy, which can quite literally save your life!  And if your first colonoscopy is normal and you have no family history of colon cancer, most women don't have to have it again for 10 years.  Once every ten years, you can do something that may end up saving your life, right?  We will be happy to help you get it scheduled when you are ready.  Be sure to check your insurance coverage so you understand how much it will cost.  It may be covered as a preventative service at no cost, but you should check your particular policy.

## 2016-08-21 ENCOUNTER — Encounter: Payer: Self-pay | Admitting: Family Medicine

## 2016-08-21 DIAGNOSIS — R7302 Impaired glucose tolerance (oral): Secondary | ICD-10-CM | POA: Insufficient documentation

## 2016-08-21 LAB — COMPREHENSIVE METABOLIC PANEL
ALBUMIN: 4.7 g/dL (ref 3.6–4.8)
ALT: 15 IU/L (ref 0–32)
AST: 21 IU/L (ref 0–40)
Albumin/Globulin Ratio: 1.5 (ref 1.2–2.2)
Alkaline Phosphatase: 69 IU/L (ref 39–117)
BILIRUBIN TOTAL: 0.4 mg/dL (ref 0.0–1.2)
BUN / CREAT RATIO: 17 (ref 12–28)
BUN: 13 mg/dL (ref 8–27)
CALCIUM: 9.9 mg/dL (ref 8.7–10.3)
CO2: 26 mmol/L (ref 18–29)
CREATININE: 0.78 mg/dL (ref 0.57–1.00)
Chloride: 99 mmol/L (ref 96–106)
GFR, EST AFRICAN AMERICAN: 94 mL/min/{1.73_m2} (ref >59)
GFR, EST NON AFRICAN AMERICAN: 82 mL/min/{1.73_m2} (ref >59)
GLUCOSE: 84 mg/dL (ref 65–99)
Globulin, Total: 3.1 g/dL (ref 1.5–4.5)
Potassium: 4.5 mmol/L (ref 3.5–5.2)
Sodium: 142 mmol/L (ref 134–144)
TOTAL PROTEIN: 7.8 g/dL (ref 6.0–8.5)

## 2016-08-21 LAB — LIPID PANEL
CHOL/HDL RATIO: 3.4 ratio (ref 0.0–4.4)
Cholesterol, Total: 226 mg/dL — ABNORMAL HIGH (ref 100–199)
HDL: 66 mg/dL (ref >39)
LDL Calculated: 146 mg/dL — ABNORMAL HIGH (ref 0–99)
TRIGLYCERIDES: 72 mg/dL (ref 0–149)
VLDL CHOLESTEROL CAL: 14 mg/dL (ref 5–40)

## 2016-08-21 LAB — HEMOGLOBIN A1C
Est. average glucose Bld gHb Est-mCnc: 123 mg/dL
Hgb A1c MFr Bld: 5.9 % — ABNORMAL HIGH (ref 4.8–5.6)

## 2016-08-21 LAB — CBC WITH DIFFERENTIAL/PLATELET
BASOS: 1 %
Basophils Absolute: 0 10*3/uL (ref 0.0–0.2)
EOS (ABSOLUTE): 0.2 10*3/uL (ref 0.0–0.4)
EOS: 3 %
HEMATOCRIT: 39.9 % (ref 34.0–46.6)
HEMOGLOBIN: 13.1 g/dL (ref 11.1–15.9)
IMMATURE GRANS (ABS): 0 10*3/uL (ref 0.0–0.1)
Immature Granulocytes: 0 %
LYMPHS ABS: 2.5 10*3/uL (ref 0.7–3.1)
Lymphs: 37 %
MCH: 30 pg (ref 26.6–33.0)
MCHC: 32.8 g/dL (ref 31.5–35.7)
MCV: 91 fL (ref 79–97)
MONOCYTES: 11 %
Monocytes Absolute: 0.7 10*3/uL (ref 0.1–0.9)
Neutrophils Absolute: 3.2 10*3/uL (ref 1.4–7.0)
Neutrophils: 48 %
Platelets: 262 10*3/uL (ref 150–379)
RBC: 4.37 x10E6/uL (ref 3.77–5.28)
RDW: 13.5 % (ref 12.3–15.4)
WBC: 6.6 10*3/uL (ref 3.4–10.8)

## 2016-08-21 LAB — HEPATITIS C ANTIBODY: Hep C Virus Ab: <0.1 s/co ratio (ref 0.0–0.9)

## 2016-08-21 LAB — TSH: TSH: 2.46 u[IU]/mL (ref 0.450–4.500)

## 2016-08-21 LAB — VITAMIN D 25 HYDROXY (VIT D DEFICIENCY, FRACTURES): Vit D, 25-Hydroxy: 59.9 ng/mL (ref 30.0–100.0)

## 2016-08-31 ENCOUNTER — Other Ambulatory Visit: Payer: Self-pay

## 2016-08-31 ENCOUNTER — Other Ambulatory Visit (INDEPENDENT_AMBULATORY_CARE_PROVIDER_SITE_OTHER): Payer: BC Managed Care – PPO

## 2016-08-31 DIAGNOSIS — Z1211 Encounter for screening for malignant neoplasm of colon: Secondary | ICD-10-CM | POA: Diagnosis not present

## 2016-08-31 DIAGNOSIS — M8580 Other specified disorders of bone density and structure, unspecified site: Secondary | ICD-10-CM

## 2016-08-31 LAB — IFOBT (OCCULT BLOOD): IFOBT: NEGATIVE

## 2016-08-31 NOTE — Progress Notes (Signed)
Per Dr. Raliegh Scarlet, pt needs DEXA scan d/t history of osteopenia.  Last DEXA was 08/06/2014.  Orders placed.  Charyl Bigger, CMA

## 2016-08-31 NOTE — Progress Notes (Signed)
IFOB returned to office.  Negative x 3.  Charyl Bigger, CMA

## 2016-09-07 ENCOUNTER — Ambulatory Visit
Admission: RE | Admit: 2016-09-07 | Discharge: 2016-09-07 | Disposition: A | Discharge status: Home / Self Care | Payer: BC Managed Care – PPO | Source: Ambulatory Visit | Attending: Family Medicine | Admitting: Family Medicine

## 2016-09-07 DIAGNOSIS — M8580 Other specified disorders of bone density and structure, unspecified site: Secondary | ICD-10-CM

## 2016-11-17 ENCOUNTER — Other Ambulatory Visit: Payer: Self-pay | Admitting: Family Medicine

## 2016-11-17 DIAGNOSIS — Z1231 Encounter for screening mammogram for malignant neoplasm of breast: Secondary | ICD-10-CM

## 2016-12-18 ENCOUNTER — Ambulatory Visit
Admission: RE | Admit: 2016-12-18 | Discharge: 2016-12-18 | Disposition: A | Discharge status: Home / Self Care | Payer: BC Managed Care – PPO | Source: Ambulatory Visit | Attending: Family Medicine | Admitting: Family Medicine

## 2016-12-18 DIAGNOSIS — Z1231 Encounter for screening mammogram for malignant neoplasm of breast: Secondary | ICD-10-CM

## 2017-02-18 ENCOUNTER — Ambulatory Visit (INDEPENDENT_AMBULATORY_CARE_PROVIDER_SITE_OTHER): Payer: BC Managed Care – PPO | Admitting: Family Medicine

## 2017-02-18 ENCOUNTER — Encounter: Payer: Self-pay | Admitting: Family Medicine

## 2017-02-18 VITALS — BP 118/76 | HR 75 | Ht 61.5 in | Wt 129.5 lb

## 2017-02-18 DIAGNOSIS — M159 Polyosteoarthritis, unspecified: Secondary | ICD-10-CM

## 2017-02-18 DIAGNOSIS — R7302 Impaired glucose tolerance (oral): Secondary | ICD-10-CM | POA: Diagnosis not present

## 2017-02-18 DIAGNOSIS — E7800 Pure hypercholesterolemia, unspecified: Secondary | ICD-10-CM

## 2017-02-18 DIAGNOSIS — Z832 Family history of diseases of the blood and blood-forming organs and certain disorders involving the immune mechanism: Secondary | ICD-10-CM

## 2017-02-18 DIAGNOSIS — Z83438 Family history of other disorder of lipoprotein metabolism and other lipidemia: Secondary | ICD-10-CM

## 2017-02-18 LAB — POCT GLYCOSYLATED HEMOGLOBIN (HGB A1C): HEMOGLOBIN A1C: 6

## 2017-02-18 MED ORDER — FISH OIL 1000 MG PO CAPS: 3.0000 | ORAL_CAPSULE | Freq: Every day | ORAL | Status: DC

## 2017-02-18 NOTE — Patient Instructions (Addendum)
You can use apps such as lose it or my fitness pal to track your foods to see what percent carbohydrates you're eating etc.  Risk factors for prediabetes and type 2 diabetes  Researchers don't fully understand why some people develop prediabetes and type 2 diabetes and others don't.  It's clear that certain factors increase the risk, however, including:  Weight. The more fatty tissue you have, the more resistant your cells become to insulin.  Inactivity. The less active you are, the greater your risk. Physical activity helps you control your weight, uses up glucose as energy and makes your cells more sensitive to insulin.  Family history. Your risk increases if a parent or sibling has type 2 diabetes.  Race. Although it's unclear why, people of certain races - including blacks, Hispanics, American Indians and Asian-Americans - are at higher risk.  Age. Your risk increases as you get older. This may be because you tend to exercise less, lose muscle mass and gain weight as you age. But type 2 diabetes is also increasing dramatically among children, adolescents and younger adults.  Gestational diabetes. If you developed gestational diabetes when you were pregnant, your risk of developing prediabetes and type 2 diabetes later increases. If you gave birth to a baby weighing more than 9 pounds (4 kilograms), you're also at risk of type 2 diabetes.  Polycystic ovary syndrome. For women, having polycystic ovary syndrome - a common condition characterized by irregular menstrual periods, excess hair growth and obesity - increases the risk of diabetes.  High blood pressure. Having blood pressure over 140/90 millimeters of mercury (mm Hg) is linked to an increased risk of type 2 diabetes.  Abnormal cholesterol and triglyceride levels. If you have low levels of high-density lipoprotein (HDL), or "good," cholesterol, your risk of type 2 diabetes is higher. Triglycerides are another type of fat carried in the blood.  People with high levels of triglycerides have an increased risk of type 2 diabetes. Your doctor can let you know what your cholesterol and triglyceride levels are.  A good guide to good carbs: The glycemic index ---If you have diabetes, or at risk for diabetes, you know all too well that when you eat carbohydrates, your blood sugar goes up. The total amount of carbs you consume at a meal or in a snack mostly determines what your blood sugar will do. But the food itself also plays a role. A serving of white rice has almost the same effect as eating pure table sugar - a quick, high spike in blood sugar. A serving of lentils has a slower, smaller effect.  ---Picking good sources of carbs can help you control your blood sugar and your weight. Even if you don't have diabetes, eating healthier carbohydrate-rich foods can help ward off a host of chronic conditions, from heart disease to various cancers to, well, diabetes.  ---One way to choose foods is with the glycemic index (GI). This tool measures how much a food boosts blood sugar.  The glycemic index rates the effect of a specific amount of a food on blood sugar compared with the same amount of pure glucose. A food with a glycemic index of 28 boosts blood sugar only 28% as much as pure glucose. One with a GI of 95 acts like pure glucose.    High glycemic foods result in a quick spike in insulin and blood sugar (also known as blood glucose).  Low glycemic foods have a slower, smaller effect- these are healthier for you.  Using the glycemic index Using the glycemic index is easy: choose foods in the low GI category instead of those in the high GI category (see below), and go easy on those in between. Low glycemic index (GI of 55 or less): Most fruits and vegetables, beans, minimally processed grains, pasta, low-fat dairy foods, and nuts.  Moderate glycemic index (GI 56 to 69): White and sweet potatoes, corn, white rice, couscous, breakfast cereals such  as Cream of Wheat and Mini Wheats.  High glycemic index (GI of 70 or higher): White bread, rice cakes, most crackers, bagels, cakes, doughnuts, croissants, most packaged breakfast cereals. You can see the values for 100 commons foods and get links to more at www.[email or web address].si.  Swaps for lowering glycemic index  Instead of this high-glycemic index food Eat this lower-glycemic index food  White rice Brown rice or converted rice  Instant oatmeal Steel-cut oats  Cornflakes Bran flakes  Baked potato Pasta, bulgur  White bread Whole-grain bread  Corn Peas or leafy greens       Prediabetes Eating Plan  Prediabetes--also called impaired glucose tolerance or impaired fasting glucose--is a condition that causes blood sugar (blood glucose) levels to be higher than normal. Following a healthy diet can help to keep prediabetes under control. It can also help to lower the risk of type 2 diabetes and heart disease, which are increased in people who have prediabetes. Along with regular exercise, a healthy diet:  Promotes weight loss.  Helps to control blood sugar levels.  Helps to improve the way that the body uses insulin.   WHAT DO I NEED TO KNOW ABOUT THIS EATING PLAN?   Use the glycemic index (GI) to plan your meals. The index tells you how quickly a food will raise your blood sugar. Choose low-GI foods. These foods take a longer time to raise blood sugar.  Pay close attention to the amount of carbohydrates in the food that you eat. Carbohydrates increase blood sugar levels.  Keep track of how many calories you take in. Eating the right amount of calories will help you to achieve a healthy weight. Losing about 7 percent of your starting weight can help to prevent type 2 diabetes.  You may want to follow a Mediterranean diet. This diet includes a lot of vegetables, lean meats or fish, whole grains, fruits, and healthy oils and fats.   WHAT FOODS CAN I EAT?  Grains Whole  grains, such as whole-wheat or whole-grain breads, crackers, cereals, and pasta. Unsweetened oatmeal. Bulgur. Barley. Quinoa. Brown rice. Corn or whole-wheat flour tortillas or taco shells. Vegetables Lettuce. Spinach. Peas. Beets. Cauliflower. Cabbage. Broccoli. Carrots. Tomatoes. Squash. Eggplant. Herbs. Peppers. Onions. Cucumbers. Brussels sprouts. Fruits Berries. Bananas. Apples. Oranges. Grapes. Papaya. Mango. Pomegranate. Kiwi. Grapefruit. Cherries. Meats and Other Protein Sources Seafood. Lean meats, such as chicken and [place] or lean cuts of pork and beef. Tofu. Eggs. Nuts. Beans. Dairy Low-fat or fat-free dairy products, such as yogurt, cottage cheese, and cheese. Beverages Water. Tea. Coffee. Sugar-free or diet soda. Seltzer water. Milk. Milk alternatives, such as soy or almond milk. Condiments Mustard. Relish. Low-fat, low-sugar ketchup. Low-fat, low-sugar barbecue sauce. Low-fat or fat-free mayonnaise. Sweets and Desserts Sugar-free or low-fat pudding. Sugar-free or low-fat ice cream and other frozen treats. Fats and Oils Avocado. Walnuts. Olive oil. The items listed above may not be a complete list of recommended foods or beverages. Contact your dietitian for more options.    WHAT FOODS ARE NOT RECOMMENDED?  Grains Refined white flour and  flour products, such as bread, pasta, snack foods, and cereals. Beverages Sweetened drinks, such as sweet iced tea and soda. Sweets and Desserts Baked goods, such as cake, cupcakes, pastries, cookies, and cheesecake. The items listed above may not be a complete list of foods and beverages to avoid. Contact your dietitian for more information.   This information is not intended to replace advice given to you by your health care provider. Make sure you discuss any questions you have with your health care provider.   Document Released: 12/11/2014 Document Reviewed: 12/11/2014 Elsevier Interactive Patient Education 2016 [place] for a Low Cholesterol, Low Saturated Fat Diet   Fats - Limit total intake of fats and oils. - Avoid butter, stick margarine, shortening, lard, palm and coconut oils. - Limit mayonnaise, salad dressings, gravies and sauces, unless they are homemade with low-fat ingredients. - Limit chocolate. - Choose low-fat and nonfat products, such as low-fat mayonnaise, low-fat or non-hydrogenated peanut butter, low-fat or fat-free salad dressings and nonfat gravy. - Use vegetable oil, such as canola or olive oil. - Look for margarine that does not contain trans fatty acids. - Use nuts in moderate amounts. - Read ingredient labels carefully to determine both amount and type of fat present in foods. Limit saturated and trans fats! - Avoid high-fat processed and convenience foods.  Meats and Meat Alternatives - Choose fish, chicken, [place] and lean meats. - Use dried beans, peas, lentils and tofu. - Limit egg yolks to three to four per week. - If you eat red meat, limit to no more than three servings per week and choose loin or round cuts. - Avoid fatty meats, such as bacon, sausage, franks, luncheon meats and ribs. - Avoid all organ meats, including liver.  Dairy - Choose nonfat or low-fat milk, yogurt and cottage cheese. - Most cheeses are high in fat. Choose cheeses made from non-fat milk, such as mozzarella and ricotta cheese. - Choose light or fat-free cream cheese and sour cream. - Avoid cream and sauces made with cream.  Fruits and Vegetables - Eat a wide variety of fruits and vegetables. - Use lemon juice, vinegar or "mist" olive oil on vegetables. - Avoid adding sauces, fat or oil to vegetables.  Breads, Cereals and Grains - Choose whole-grain breads, cereals, pastas and rice. - Avoid high-fat snack foods, such as granola, cookies, pies, pastries, doughnuts and croissants.  Cooking Tips - Avoid deep fried foods. - Trim visible fat off meats and remove skin from  poultry before cooking. - Bake, broil, boil, poach or roast poultry, fish and lean meats. - Drain and discard fat that drains out of meat as you cook it. - Add little or no fat to foods. - Use vegetable oil sprays to grease pans for cooking or baking. - Steam vegetables. - Use herbs or no-oil marinades to flavor foods.

## 2017-02-18 NOTE — Progress Notes (Signed)
Impression and Recommendations:    1. Glucose intolerance (impaired glucose tolerance) A1c 5.9 January '18   2. Family history of mixed hyperlipidemia   3. Elevated LDL cholesterol level   4. Generalized OA    - A1c today was 6.   She will use the lose it app and track everything she eats in order to see how many carbohydrates are in the various fruits and vegetables she is eating.   - We will check her cholesterol today.  It's been 6 months since we last checked.  Prior elevated LDL.  We are hoping see improvement.  - Patient was only taking 1200 milligrams of fish oil, told her to increase to 3-4 tablets daily in order to get benefit -  Handouts provided on low cholesterol diet, and prediabetic prudent diets. - For her arthritis, continue strength and flexibility training as well as doing cardio daily.  She will continue to take her calcium and vitamin D supplement  The patient was counseled, risk factors were discussed, anticipatory guidance given.   Orders Placed This Encounter  Procedures  . Lipid panel  . POCT glycosylated hemoglobin (Hb A1C)    Gross side effects, risk and benefits, and alternatives of medications and treatment plan in general discussed with patient.  Patient is aware that all medications have potential side effects and we are unable to predict every side effect or drug-drug interaction that may occur.   Patient will call with any questions prior to using medication if they have concerns.  Expresses verbal understanding and consents to current therapy and treatment regimen.  No barriers to understanding were identified.  Red flag symptoms and signs discussed in detail.  Patient expressed understanding regarding what to do in case of emergency\urgent symptoms  Please see AVS handed out to patient at the end of our visit for further patient instructions/ counseling done pertaining to today's office visit.   Return for For wellness exam & health maintenance  eval- yrly- around Feb-March; sooner prn.     Note: This document was prepared using Dragon voice recognition software and may include unintentional dictation errors.  Jordan Wade 10:09 AM --------------------------------------------------------------------------------------------------------------------------------------------------------------------------------------------------------------------------------------------    Subjective:    CC:  Chief Complaint  Patient presents with  . Follow-up    HPI: Jordan Wade is a 61 y.o. female who presents to [place] at [facility] today for issues as discussed below.  ONce she was told in Jan about her med issues- she did the [employer] 21 days.  Lost 8 lbs and then she has been watching her diet.  Goes to the gym daily. 7min cardio, 88min stretch/ flexibilty.   Prediabetes: Her A1c approximately 6 months ago was 5.9.  A1c today is 6.0.  Patient is frustrated as she is worked very hard and lost weight as well as eating much healthier.  Overwt: She has lost 8 pounds since last office visit and now a normal weight range.  Congratulated patient.  She continues to exercise about an hour at the gym daily doing weightbearing cardio as well as weight lifting and flexibility training.  Elevated LDL:  Patient did the [employer] and eating barely any dairy or meats for about one month but now just eating chicken.  Still cutting back on sat and trans fats      Problem  Elevated Ldl Cholesterol Level  Generalized Oa  Overweight (Bmi 25.0-29.9) (Resolved)     Wt Readings from Last  3 Encounters:  02/18/17 129 lb 8 oz (58.7 kg)  08/20/16 137 lb (62.1 kg)  04/15/16 137 lb 1.6 oz (62.2 kg)   BP Readings from Last 3 Encounters:  02/18/17 118/76  08/20/16 138/73  04/15/16 132/76   Pulse Readings from Last 3 Encounters:  02/18/17 75  08/20/16 74  04/15/16 80   BMI Readings from Last 3 Encounters:    02/18/17 24.07 kg/m  08/20/16 25.47 kg/m  04/15/16 25.49 kg/m     Patient Care Team    Relationship Specialty Notifications Start End  Mellody Dance, DO PCP - General Family Medicine  04/15/16   Mcarthur Rossetti, [place] Physician Orthopedic Surgery  04/15/16   Gavin Pound, MD Consulting Physician Rheumatology  04/15/16   Jari Pigg, MD Consulting Physician Dermatology  04/15/16      Patient Active Problem List   Diagnosis Date Noted  . Glucose intolerance (impaired glucose tolerance) A1c 5.9 January '18 08/21/2016    Priority: High  . Elevated LDL cholesterol level 08/10/2016    Priority: High  . History of osteopenia 04/17/2016    Priority: High  . H/O bilateral hip replacements 04/17/2016    Priority: Medium  . Generalized OA 04/17/2016    Priority: Low  . Family history of colon cancer 04/17/2016    Priority: Low  . Family history of mixed hyperlipidemia 04/17/2016    Priority: Low  . Family history of stroke or transient ischemic attack in mother 04/17/2016    Priority: Low    Past Medical history, Surgical history, Family history, Social history, Allergies and Medications have been entered into the medical record, reviewed and changed as needed.    Current Meds  Medication Sig  . aspirin 81 MG chewable tablet Chew 81 mg by mouth daily.  . Calcium Carb-Cholecalciferol [phone] MG-UNIT CAPS Take 1 capsule by mouth 2 (two) times daily.   . Glucosamine-Chondroit-Vit C-Mn (GLUCOSAMINE 1500 COMPLEX PO) Take 1,500 mg by mouth daily.  . naproxen sodium (ANAPROX) 220 MG tablet Take 220 mg by mouth as needed.  . Omega-3 Fatty Acids (FISH OIL) 1000 MG CAPS Take 3 capsules (3,000 mg total) by mouth daily. (pt takes 3 1200 tabs d)  . [DISCONTINUED] Omega-3 Fatty Acids (FISH OIL) 1000 MG CAPS Take 1 capsule by mouth daily.    Allergies:  No Known Allergies   Review of Systems: General:   Denies fever, chills, unexplained weight loss.  Optho/Auditory:    Denies visual changes, blurred vision/LOV Respiratory:   Denies wheeze, DOE more than baseline levels.  Cardiovascular:   Denies chest pain, palpitations, new onset peripheral edema  Gastrointestinal:   Denies nausea, vomiting, diarrhea, abd pain.  Genitourinary: Denies dysuria, freq/ urgency, flank pain or discharge from genitals.  Endocrine:     Denies hot or cold intolerance, polyuria, polydipsia. Musculoskeletal:   Denies unexplained myalgias, joint swelling, unexplained arthralgias, gait problems.  Skin:  Denies new onset rash, suspicious lesions Neurological:     Denies dizziness, unexplained weakness, numbness  Psychiatric/Behavioral:   Denies mood changes, suicidal or homicidal ideations, hallucinations    Objective:   Blood pressure 118/76, pulse 75, height 5' 1.5" (1.562 m), weight 129 lb 8 oz (58.7 kg). Body mass index is 24.07 kg/m. General:  Well Developed, well nourished, appropriate for stated age.  Neuro:  Alert and oriented,  extra-ocular muscles intact  HEENT:  Normocephalic, atraumatic, neck supple, no carotid bruits appreciated  Skin:  no gross rash, warm, pink. Cardiac:  RRR, S1 S2 Respiratory:  ECTA B/L and A/P, Not using accessory muscles, speaking in full sentences- unlabored. Vascular:  Ext warm, no cyanosis apprec.; cap RF less 2 sec. Psych:  No HI/SI, judgement and insight good, Euthymic mood. Full Affect.

## 2017-02-19 LAB — LIPID PANEL
CHOLESTEROL TOTAL: 201 mg/dL — AB (ref 100–199)
Chol/HDL Ratio: 3.5 ratio (ref 0.0–4.4)
HDL: 58 mg/dL (ref >39)
LDL Calculated: 124 mg/dL — ABNORMAL HIGH (ref 0–99)
TRIGLYCERIDES: 97 mg/dL (ref 0–149)
VLDL Cholesterol Cal: 19 mg/dL (ref 5–40)

## 2017-09-16 ENCOUNTER — Encounter: Payer: BC Managed Care – PPO | Admitting: Family Medicine

## 2017-09-21 ENCOUNTER — Ambulatory Visit (INDEPENDENT_AMBULATORY_CARE_PROVIDER_SITE_OTHER): Payer: BC Managed Care – PPO

## 2017-09-21 ENCOUNTER — Encounter (INDEPENDENT_AMBULATORY_CARE_PROVIDER_SITE_OTHER): Payer: Self-pay | Admitting: Orthopaedic Surgery

## 2017-09-21 ENCOUNTER — Ambulatory Visit (INDEPENDENT_AMBULATORY_CARE_PROVIDER_SITE_OTHER): Payer: BC Managed Care – PPO | Admitting: Orthopaedic Surgery

## 2017-09-21 DIAGNOSIS — G8929 Other chronic pain: Secondary | ICD-10-CM

## 2017-09-21 DIAGNOSIS — M25512 Pain in left shoulder: Secondary | ICD-10-CM | POA: Diagnosis not present

## 2017-09-21 DIAGNOSIS — M7541 Impingement syndrome of right shoulder: Secondary | ICD-10-CM | POA: Diagnosis not present

## 2017-09-21 MED ORDER — LIDOCAINE HCL 1 % IJ SOLN
3.0000 mL | INTRAMUSCULAR | Status: AC | PRN
Start: 2017-09-21 — End: 2017-09-21
  Administered 2017-09-21: 3 mL

## 2017-09-21 MED ORDER — METHYLPREDNISOLONE ACETATE 40 MG/ML IJ SUSP
40.0000 mg | INTRAMUSCULAR | Status: AC | PRN
  Administered 2017-09-21: 40 mg via INTRA_ARTICULAR

## 2017-09-21 NOTE — Progress Notes (Signed)
Office Visit Note   Patient: Jordan Wade           Date of Birth: 1955-09-05           MRN: [id number] Visit Date: 09/21/2017              Requested by: Mellody Dance, DO [place], [place] [id number] PCP: Mellody Dance, DO   Assessment & Plan: Visit Diagnoses:  1. Chronic left shoulder pain   2. Impingement syndrome of right shoulder     Plan: I do feel that this is significant shoulder impingement syndrome of left shoulder.  I showed her a shoulder model and went over her x-rays and talked in detail about what her diagnosis is as well as treatment modalities.  I talked her about trying a shoulder steroid injection in the left subacromial space and she agreed with this.  Also feel that she is a candidate for physical therapy as well.  I gave her a prescription for this.  All questions concerns were answered and addressed.  I will see her back in about 6 weeks to see how she is doing overall.  She is not a diabetic I would consider repeat injection sooner than later.  Follow-Up Instructions: Return in about 6 weeks (around 11/02/2017).   Orders:  Orders Placed This Encounter  Procedures  . Large Joint Inj  . XR Shoulder Left   No orders of the defined types were placed in this encounter.     Procedures: Large Joint Inj: L subacromial bursa on 09/21/2017 9:57 AM Indications: pain and diagnostic evaluation Details: 64 G 1.5 in needle  Arthrogram: No  Medications: 3 mL lidocaine 1 %; 40 mg methylPREDNISolone acetate 40 MG/ML Outcome: tolerated well, no immediate complications Procedure, treatment alternatives, risks and benefits explained, specific risks discussed. Consent was given by the patient. Immediately prior to procedure a time out was called to verify the correct patient, procedure, equipment, support staff and site/side marked as required. Patient was prepped and draped in the usual sterile fashion.       Clinical Data: No additional  findings.   Subjective: Chief Complaint  Patient presents with  . Left Shoulder - Pain  Patient is a very pleasant 62 year old former patient of mine that I performed bilateral hip replacements on 5 years ago.  She comes with a years worth of left shoulder pain.  She is right-hand dominant.  She has decreased range of motion and strength of that shoulder with no known injury but she is someone who does regularly work out.  She is tried to work on activity modification and anti-inflammatories and time.  She is having a harder time putting undergarments and reaching into a coat as well as reaching behind her.  Again she denies any specific injuries to that shoulder.  She denies any neck pain and denies any numbness and tingling in her left upper extremity.  HPI  Review of Systems She currently denies any headache, chest pain, shortness of breath, fever, chills, nausea, vomiting.  Objective: Vital Signs: There were no vitals taken for this visit.  Physical Exam She is alert and oriented x3 and in no acute distress Ortho Exam Examination of her left shoulder shows deficits with internal rotation and adduction with her reaching only to the lower aspect of her lumbar spine compared to much higher up on her nonpainful right shoulder.  She does abduct her shoulder well and does not use her deltoids to abduct her  shoulder.  Rotator cuff itself feels strong.  Her liftoff is negative.  She does have positive crossarm pain with positive Neer and Hawkins signs well. Specialty Comments:  No specialty comments available.  Imaging: Xr Shoulder Left  Result Date: 09/21/2017 3 views of the left shoulder show well located shoulder with no acute findings or complicating features.  There is minimal AC joint arthritis but a decrease in the subacromial outlet.    PMFS History: Patient Active Problem List   Diagnosis Date Noted  . Impingement syndrome of right shoulder 09/21/2017  . Glucose intolerance  (impaired glucose tolerance) A1c 5.9 January '18 08/21/2016  . Elevated LDL cholesterol level 08/10/2016  . History of osteopenia 04/17/2016  . Generalized OA 04/17/2016  . Family history of colon cancer 04/17/2016  . Family history of mixed hyperlipidemia 04/17/2016  . Family history of stroke or transient ischemic attack in mother 04/17/2016  . H/O bilateral hip replacements 04/17/2016   Past Medical History:  Diagnosis Date  . Arthritis   . PONV (postoperative nausea and vomiting)     Family History  Problem Relation Age of Onset  . Cancer Mother        colon  . Hyperlipidemia Mother   . Stroke Mother   . Hypertension Father   . Cancer Maternal Grandfather        colon  . Diabetes Maternal Grandfather     Past Surgical History:  Procedure Laterality Date  . BILATERAL ANTERIOR TOTAL HIP ARTHROPLASTY Bilateral 11/04/2012   Procedure: BILATERAL ANTERIOR TOTAL HIP ARTHROPLASTY;  Surgeon: Mcarthur Rossetti, MD;  Location: [facility];  Service: Orthopedics;  Laterality: Bilateral;  . CESAREAN SECTION     x 2   Social History   Occupational History  . Not on file  Tobacco Use  . Smoking status: Never Smoker  . Smokeless tobacco: Never Used  Substance and Sexual Activity  . Alcohol use: No  . Drug use: No  . Sexual activity: Not Currently

## 2017-09-27 ENCOUNTER — Encounter: Payer: Self-pay | Admitting: Adult Health

## 2017-09-27 ENCOUNTER — Ambulatory Visit: Payer: BC Managed Care – PPO | Admitting: Adult Health

## 2017-09-27 VITALS — BP 112/74 | HR 85 | Temp 98.3°F | Ht 61.5 in | Wt 136.2 lb

## 2017-09-27 DIAGNOSIS — J40 Bronchitis, not specified as acute or chronic: Secondary | ICD-10-CM | POA: Diagnosis not present

## 2017-09-27 MED ORDER — HYDROCODONE-HOMATROPINE 5-1.5 MG PO TABS: 1.0000 | ORAL_TABLET | Freq: Two times a day (BID) | ORAL | 0 refills | Status: DC | PRN

## 2017-09-27 MED ORDER — AZITHROMYCIN 250 MG PO TABS: ORAL_TABLET | ORAL | 0 refills | Status: DC

## 2017-09-27 NOTE — Patient Instructions (Signed)
Cough, Adult A cough helps to clear your throat and lungs. A cough may last only 2-3 weeks (acute), or it may last longer than 8 weeks (chronic). Many different things can cause a cough. A cough may be a sign of an illness or another medical condition. Follow these instructions at home:  Pay attention to any changes in your cough.  Take medicines only as told by your doctor. ? If you were prescribed an antibiotic medicine, take it as told by your doctor. Do not stop taking it even if you start to feel better. ? Talk with your doctor before you try using a cough medicine.  Drink enough fluid to keep your pee (urine) clear or pale yellow.  If the air is dry, use a cold steam vaporizer or humidifier in your home.  Stay away from things that make you cough at work or at home.  If your cough is worse at night, try using extra pillows to raise your head up higher while you sleep.  Do not smoke, and try not to be around smoke. If you need help quitting, ask your doctor.  Do not have caffeine.  Do not drink alcohol.  Rest as needed. Contact a doctor if:  You have new problems (symptoms).  You cough up yellow fluid (pus).  Your cough does not get better after 2-3 weeks, or your cough gets worse.  Medicine does not help your cough and you are not sleeping well.  You have pain that gets worse or pain that is not helped with medicine.  You have a fever.  You are losing weight and you do not know why.  You have night sweats. Get help right away if:  You cough up blood.  You have trouble breathing.  Your heartbeat is very fast. This information is not intended to replace advice given to you by your health care provider. Make sure you discuss any questions you have with your health care provider. Document Released: 04/09/2011 Document Revised: 01/02/2016 Document Reviewed: 10/03/2014 Elsevier Interactive Patient Education  2018 [employer].  Please take Azithromycin and cough  tablets as directed. Increase water/rest/ vit c-2,000mg /day when not feeling well. Alternate OTC Acetaminophen and Ibuprofen as needed for aches. If symptoms persist after antibiotic completed then please call clinic. FEEL BETTER!

## 2017-09-27 NOTE — Progress Notes (Signed)
Subjective:    Patient ID: Jordan Wade, female    DOB: July 15, 1956, 62 y.o.   MRN: [id number]  HPI:  Jordan Wade presents with strong, productive cough (thick/yellow), copious thick/yellow nasal drainage, fatigue, and ear fullness that all started last week and has steadily been worsening. She denies CP/dyspnea/dizziness/HA/NV/D/fever. She reports several members of her church have had bronchitis. She has been taking OTC Sudafed with only minimal sx relief.   Patient Care Team    Relationship Specialty Notifications Start End  Mellody Dance, DO PCP - General Family Medicine  04/15/16   Mcarthur Rossetti, [place] Physician Orthopedic Surgery  04/15/16   Gavin Pound, MD Consulting Physician Rheumatology  04/15/16   Jari Pigg, MD Consulting Physician Dermatology  04/15/16     Patient Active Problem List   Diagnosis Date Noted  . Bronchitis 09/27/2017  . Impingement syndrome of right shoulder 09/21/2017  . Glucose intolerance (impaired glucose tolerance) A1c 5.9 January '18 08/21/2016  . Elevated LDL cholesterol level 08/10/2016  . History of osteopenia 04/17/2016  . Generalized OA 04/17/2016  . Family history of colon cancer 04/17/2016  . Family history of mixed hyperlipidemia 04/17/2016  . Family history of stroke or transient ischemic attack in mother 04/17/2016  . H/O bilateral hip replacements 04/17/2016     Past Medical History:  Diagnosis Date  . Arthritis   . PONV (postoperative nausea and vomiting)      Past Surgical History:  Procedure Laterality Date  . BILATERAL ANTERIOR TOTAL HIP ARTHROPLASTY Bilateral 11/04/2012   Procedure: BILATERAL ANTERIOR TOTAL HIP ARTHROPLASTY;  Surgeon: Mcarthur Rossetti, MD;  Location: [facility];  Service: Orthopedics;  Laterality: Bilateral;  . CESAREAN SECTION     x 2     Family History  Problem Relation Age of Onset  . Cancer Mother        colon  . Hyperlipidemia Mother   . Stroke Mother   . Hypertension  Father   . Cancer Maternal Grandfather        colon  . Diabetes Maternal Grandfather      Social History   Substance and Sexual Activity  Drug Use No     Social History   Substance and Sexual Activity  Alcohol Use No     Social History   Tobacco Use  Smoking Status Never Smoker  Smokeless Tobacco Never Used     Outpatient Encounter Medications as of 09/27/2017  Medication Sig  . aspirin 81 MG chewable tablet Chew 81 mg by mouth daily.  . Calcium Carb-Cholecalciferol [phone] MG-UNIT CAPS Take 1 capsule by mouth 2 (two) times daily.   . Glucosamine-Chondroit-Vit C-Mn (GLUCOSAMINE 1500 COMPLEX PO) Take 1,500 mg by mouth daily.  . Omega-3 Fatty Acids (FISH OIL) 1000 MG CAPS Take 3 capsules (3,000 mg total) by mouth daily. (pt takes 3 1200 tabs d)  . azithromycin (ZITHROMAX) 250 MG tablet 2 tabs day one.  1 tab days two-five  . Hydrocodone-Homatropine 5-1.5 MG TABS Take 1 tablet by mouth 2 (two) times daily as needed.  . [DISCONTINUED] naproxen sodium (ANAPROX) 220 MG tablet Take 220 mg by mouth as needed.   No facility-administered encounter medications on file as of 09/27/2017.     Allergies: Patient has no known allergies.  Body mass index is 25.32 kg/m.  Blood pressure 112/74, pulse 85, temperature 98.3 F (36.8 C), temperature source Oral, height 5' 1.5" (1.562 m), weight 136 lb 3.2 oz (61.8 kg).   Review of Systems  Constitutional: Positive for activity change and fatigue. Negative for appetite change, chills, diaphoresis, fever and unexpected weight change.  HENT: Positive for congestion, postnasal drip and sore throat. Negative for sinus pressure, sinus pain, trouble swallowing and voice change.   Eyes: Negative for visual disturbance.  Respiratory: Positive for cough. Negative for chest tightness, shortness of breath, wheezing and stridor.   Cardiovascular: Negative for chest pain, palpitations and leg swelling.  Gastrointestinal: Negative for abdominal  distention, abdominal pain, blood in stool, constipation, diarrhea, nausea and vomiting.  Endocrine: Negative for cold intolerance, heat intolerance, polydipsia, polyphagia and polyuria.  Neurological: Negative for dizziness and headaches.  Psychiatric/Behavioral: Positive for sleep disturbance.       Objective:   Physical Exam  Constitutional: She is oriented to person, place, and time. She appears well-developed and well-nourished. No distress.  HENT:  Head: Normocephalic and atraumatic.  Right Ear: External ear normal.  Left Ear: External ear normal.  Cardiovascular: Normal rate, regular rhythm, normal heart sounds and intact distal pulses.  No murmur heard. Pulmonary/Chest: Effort normal and breath sounds normal. No respiratory distress. She has no wheezes. She has no rales. She exhibits no tenderness.  Neurological: She is alert and oriented to person, place, and time.  Skin: Skin is warm and dry. No rash noted. She is not diaphoretic. No erythema.  Psychiatric: She has a normal mood and affect. Her behavior is normal. Judgment and thought content normal.  Nursing note and vitals reviewed.         Assessment & Plan:   1. Bronchitis     Bronchitis [place] Controlled Substance Database reviewed- no contraindications noted. Please take Azithromycin and cough tablets as directed. Increase water/rest/ vit c-2,000mg /day when not feeling well. Alternate OTC Acetaminophen and Ibuprofen as needed for aches. If symptoms persist after antibiotic completed then please call clinic.    FOLLOW-UP:  Return if symptoms worsen or fail to improve.

## 2017-09-27 NOTE — Assessment & Plan Note (Signed)
[place] Controlled Substance Database reviewed- no contraindications noted. Please take Azithromycin and cough tablets as directed. Increase water/rest/ vit c-2,000mg /day when not feeling well. Alternate OTC Acetaminophen and Ibuprofen as needed for aches. If symptoms persist after antibiotic completed then please call clinic.

## 2017-10-04 ENCOUNTER — Encounter: Payer: Self-pay | Admitting: Family Medicine

## 2017-10-04 ENCOUNTER — Ambulatory Visit (INDEPENDENT_AMBULATORY_CARE_PROVIDER_SITE_OTHER): Payer: BC Managed Care – PPO | Admitting: Family Medicine

## 2017-10-04 VITALS — BP 134/72 | HR 59 | Ht 61.5 in | Wt 135.0 lb

## 2017-10-04 DIAGNOSIS — Z8739 Personal history of other diseases of the musculoskeletal system and connective tissue: Secondary | ICD-10-CM

## 2017-10-04 DIAGNOSIS — Z0000 Encounter for general adult medical examination without abnormal findings: Secondary | ICD-10-CM | POA: Diagnosis not present

## 2017-10-04 NOTE — Progress Notes (Signed)
Impression and Recommendations:    1. Encounter for wellness examination   2. History of osteopenia      1) Anticipatory Guidance: Discussed importance of wearing a seatbelt while driving, not texting while driving; sunscreen when outside along with yearly skin surveillance; eating a well balanced and modest diet; physical activity at least 25 minutes per day or 150 min/ week of moderate to intense activity.  2) Immunizations / Screenings / Labs:  All immunizations and screenings that patient agrees to, are up-to-date per recommendations or will be updated today.  Patient understands the needs for q 45mo dental and yearly vision screens which pt will schedule independently. Obtain CBC, CMP, HgA1c, Lipid panel, TSH and vit D when fasting if not already done recently.   3) Weight:   Improve nutrient density of diet through increasing intake of fruits and vegetables and decreasing saturated/trans fats, white flour products and refined sugar products.   4) h/o osteopenia- Bone scan UTD and WNL.  -use sunscreen daily, especially over the area of skin from your recent melanoma removal surgery.  -pt sent home with stool cards.  -Drink adequate amounts of water per day, equal to half of your body weight in oz per day.   Orders Placed This Encounter  Procedures  . Comprehensive metabolic panel  . Hemoglobin A1c  . Lipid panel  . VITAMIN D 25 Hydroxy (Vit-D Deficiency, Fractures)  . TSH  . CBC with Differential/Platelet  . Vitamin B12    Gross side effects, risk and benefits, and alternatives of medications discussed with patient.  Patient is aware that all medications have potential side effects and we are unable to predict every side effect or drug-drug interaction that may occur.  Expresses verbal understanding and consents to current therapy plan and treatment regimen.  F-up preventative CPE in 1 year. F/up sooner for chronic care management as discussed and/or prn.  Please  see orders placed and AVS handed out to patient at the end of our visit for further patient instructions/ counseling done pertaining to today's office visit.  This document serves as a record of services personally performed by Mellody Dance, DO. It was created on her behalf by Mayer Masker, a trained medical scribe. The creation of this record is based on the scribe's personal observations and the provider's statements to them.   drug seeking behavior   Subjective:    Chief Complaint  Patient presents with  . Annual Exam   CC: CPE  HPI: Jordan Wade is a 62 y.o. female who presents to [place] [facility] today a yearly health maintenance exam.  Health Maintenance Summary Reviewed and updated, unless pt declines services.  Colonoscopy:   2015 UTD, repeat 5 years so due in 2020.   Tobacco History Reviewed:   Y  CT scan for screening lung CA:   NA Abdominal Ultrasound:   unnecessary  ( Unnecessary secondary to < 42 years old) Alcohol:    No concerns, no excessive use Exercise Habits:   Not meeting AHA guidelines STD concerns:   None. No new sexual partners Drug Use:   None Birth control method:   N/a- menopausal. Menses regular:     N/a- menopausal. Lumps or breast concerns:      No. She has a mammogram scheduled soon. She goes yearly. She does not check her breasts Pap: UTD. No new sexual partners or history of CA. Breast Cancer Family History:      No Bone/  DEXA scan:    09-07-2017 done, UTD Skin: melanoma removed by Dr. Iran Planas, plastic surgery, in Oct. 2018. Dr. Delman Cheadle, is her dermatologist.   Health maintenance: She has had a Zoster vaccination 2017, but not shingrix. She also had Tdap 2017 as well.  Vision: eye exam, UTD for dilated eye exam. Dental: she goes Q 6 months- she brushes her teeth twice/day. Hearing: no problems.  Diet/exercise, she is eating a lot of fiber and drinking a lot of water. She has been drinking 25-50 oz of water per  day.  She drinks regular coffee.  Immunization History  Administered Date(s) Administered  . Influenza-Unspecified 04/10/2016, 04/10/2017  . Tdap 08/14/2015  . Zoster 08/11/2015    Health Maintenance  Topic Date Due  . HIV Screening  02/18/2029 (Originally 05/08/1971)  . PAP SMEAR  08/13/2018  . DEXA SCAN  09/07/2018  . COLONOSCOPY  11/24/2018  . MAMMOGRAM  12/19/2018  . TETANUS/TDAP  08/13/2025  . INFLUENZA VACCINE  Completed  . Hepatitis C Screening  Completed     Wt Readings from Last 3 Encounters:  10/04/17 135 lb (61.2 kg)  09/27/17 136 lb 3.2 oz (61.8 kg)  02/18/17 129 lb 8 oz (58.7 kg)   BP Readings from Last 3 Encounters:  10/04/17 134/72  09/27/17 112/74  02/18/17 118/76   Pulse Readings from Last 3 Encounters:  10/04/17 (!) 59  09/27/17 85  02/18/17 75     Past Medical History:  Diagnosis Date  . Arthritis   . PONV (postoperative nausea and vomiting)       Past Surgical History:  Procedure Laterality Date  . BILATERAL ANTERIOR TOTAL HIP ARTHROPLASTY Bilateral 11/04/2012   Procedure: BILATERAL ANTERIOR TOTAL HIP ARTHROPLASTY;  Surgeon: Mcarthur Rossetti, MD;  Location: [facility];  Service: Orthopedics;  Laterality: Bilateral;  . CESAREAN SECTION     x 2      Family History  Problem Relation Age of Onset  . Cancer Mother        colon  . Hyperlipidemia Mother   . Stroke Mother   . Hypertension Father   . Cancer Maternal Grandfather        colon  . Diabetes Maternal Grandfather       Social History   Substance and Sexual Activity  Drug Use No  ,   Social History   Substance and Sexual Activity  Alcohol Use No  ,   Social History   Tobacco Use  Smoking Status Never Smoker  Smokeless Tobacco Never Used  ,   Social History   Substance and Sexual Activity  Sexual Activity Not Currently    Current Outpatient Medications on File Prior to Visit  Medication Sig Dispense Refill  . aspirin 81 MG chewable tablet Chew 81 mg  by mouth daily.    . Calcium Carb-Cholecalciferol [phone] MG-UNIT CAPS Take 1 capsule by mouth 2 (two) times daily.     . Glucosamine-Chondroit-Vit C-Mn (GLUCOSAMINE 1500 COMPLEX PO) Take 1,500 mg by mouth daily.    . Omega-3 Fatty Acids (FISH OIL) 1000 MG CAPS Take 3 capsules (3,000 mg total) by mouth daily. (pt takes 3 1200 tabs d)     No current facility-administered medications on file prior to visit.     Allergies: Patient has no known allergies.  Review of Systems: General:   Denies fever, chills, unexplained weight loss.  Optho/Auditory:   Denies visual changes, blurred vision/LOV Respiratory:   Denies SOB, DOE more than baseline levels.  Cardiovascular:  Denies chest pain, palpitations, new onset peripheral edema  Gastrointestinal:   Denies nausea, vomiting, diarrhea.  Genitourinary: Denies dysuria, freq/ urgency, flank pain or discharge from genitals.  Endocrine:     Denies hot or cold intolerance, polyuria, polydipsia. Musculoskeletal:   Denies unexplained myalgias, joint swelling, unexplained arthralgias, gait problems.  Skin:  Denies rash, suspicious lesions Neurological:     Denies dizziness, unexplained weakness, numbness  Psychiatric/Behavioral:   Denies mood changes, suicidal or homicidal ideations, hallucinations    Objective:    Blood pressure 134/72, pulse (!) 59, height 5' 1.5" (1.562 m), weight 135 lb (61.2 kg), SpO2 100 %. Body mass index is 25.09 kg/m. General Appearance:    Alert, cooperative, no distress, appears stated age  Head:    Normocephalic, without obvious abnormality, atraumatic  Eyes:    PERRL, conjunctiva/corneas clear, EOM's intact, fundi    benign, both eyes  Ears:    Normal TM's and external ear canals, both ears  Nose:   Nares normal, septum midline, mucosa normal, no drainage    or sinus tenderness  Throat:   Lips w/o lesion, mucosa moist, and tongue normal; teeth and   gums normal  Neck:   Supple, symmetrical, trachea midline, no  adenopathy;    thyroid:  no enlargement/tenderness/nodules; no carotid   bruit or JVD  Back:     Symmetric, no curvature, ROM normal, no CVA tenderness  Lungs:     Clear to auscultation bilaterally, respirations unlabored, no       Wh/ R/ R  Chest Wall:    No tenderness or gross deformity; normal excursion   Heart:    Regular rate and rhythm, S1 and S2 normal, no murmur, rub   or gallop  Breast Exam:    No tenderness, masses, or nipple abnormality b/l; no d/c. She has a scar 2 inches over left nipple.  Abdomen:     Soft, non-tender, bowel sounds active all four quadrants, NO   G/R/R, no masses, no organomegaly. One small periumbilical hernia approx 1BP-1WC diameter at 7 o clock position. She has a vertical scar from C section.  Genitalia:   Not done in office today.   Rectal:    Not done in office, she will be sent home with stool cards  Extremities:   Extremities normal, atraumatic, no cyanosis or gross edema  Pulses:   2+ and symmetric all extremities  Skin:   Warm, dry, Skin color, texture, turgor normal, no obvious rashes or lesions Psych: No HI/SI, judgement and insight good, Euthymic mood. Full Affect.  Neurologic:   CNII-XII intact, normal strength, sensation and reflexes    Throughout   DEXA:    [email or web address]   Based on the [place] FRAX tool, a 64 year old white woman with no other risk factors has a 9.3% 10-year risk for any osteoporotic fracture. White women between the ages of 76 and 12 years with equivalent or greater 10-year fracture risks based on specific risk factors include but are not limited to the following persons: 51) a 64 year old current smoker with a BMI less than 21 kg/m2, daily alcohol use, and parental fracture history; 79) a 64 year old woman with a parental fracture history; 50) a 64 year old woman with a BMI less than 21 kg/m2 and daily alcohol use; and 22) a 64 year old current smoker with daily alcohol use.   The FRAX tool also predicts 10-year  fracture risks for black, Asian, and Hispanic women in the [place]. In general, estimated fracture risks in nonwhite women are  lower than those for white women of the same age. Although the USPSTF recommends using a 9.3% 10-year fracture risk threshold to screen women aged 31 to 47 years,

## 2017-10-04 NOTE — Patient Instructions (Addendum)
Stool cards for home.      Will obtain full set of lab fasting labs.     Please update immunizations as patient has never had Shingrix vaccine.       Preventive Care for Adults, Female  A healthy lifestyle and preventive care can promote health and wellness. Preventive health guidelines for women include the following key practices.   A routine yearly physical is a good way to check with your health care provider about your health and preventive screening. It is a chance to share any concerns and updates on your health and to receive a thorough exam.   Visit your dentist for a routine exam and preventive care every 6 months. Brush your teeth twice a day and floss once a day. Good oral hygiene prevents tooth decay and gum disease.   The frequency of eye exams is based on your age, health, family medical history, use of contact lenses, and other factors. Follow your health care provider's recommendations for frequency of eye exams.   Eat a healthy diet. Foods like vegetables, fruits, whole grains, low-fat dairy products, and lean protein foods contain the nutrients you need without too many calories. Decrease your intake of foods high in solid fats, added sugars, and salt. Eat the right amount of calories for you.Get information about a proper diet from your health care provider, if necessary.   Regular physical exercise is one of the most important things you can do for your health. Most adults should get at least 150 minutes of moderate-intensity exercise (any activity that increases your heart rate and causes you to sweat) each week. In addition, most adults need muscle-strengthening exercises on 2 or more days a week.   Maintain a healthy weight. The body mass index (BMI) is a screening tool to identify possible weight problems. It provides an estimate of body fat based on height and weight. Your health care provider can find your BMI, and can help you achieve or maintain a healthy  weight.For adults 20 years and older:   - A BMI below 18.5 is considered underweight.   - A BMI of 18.5 to 24.9 is normal.   - A BMI of 25 to 29.9 is considered overweight.   - A BMI of 30 and above is considered obese.   Maintain normal blood lipids and cholesterol levels by exercising and minimizing your intake of trans and saturated fats.  Eat a balanced diet with plenty of fruit and vegetables. Blood tests for lipids and cholesterol should begin at age 70 and be repeated every 5 years minimum.  If your lipid or cholesterol levels are high, you are over 40, or you are at high risk for heart disease, you may need your cholesterol levels checked more frequently.Ongoing high lipid and cholesterol levels should be treated with medicines if diet and exercise are not working.   If you smoke, find out from your health care provider how to quit. If you do not use tobacco, do not start.   Lung cancer screening is recommended for adults aged 79-80 years who are at high risk for developing lung cancer because of a history of smoking. A yearly low-dose CT scan of the lungs is recommended for people who have at least a 30-pack-year history of smoking and are a current smoker or have quit within the past 15 years. A pack year of smoking is smoking an average of 1 pack of cigarettes a day for 1 year (for example: 1 pack a  day for 30 years or 2 packs a day for 15 years). Yearly screening should continue until the smoker has stopped smoking for at least 15 years. Yearly screening should be stopped for people who develop a health problem that would prevent them from having lung cancer treatment.   If you are pregnant, do not drink alcohol. If you are breastfeeding, be very cautious about drinking alcohol. If you are not pregnant and choose to drink alcohol, do not have more than 1 drink per day. One drink is considered to be 12 ounces (355 mL) of beer, 5 ounces (148 mL) of wine, or 1.5 ounces (44 mL) of  liquor.   Avoid use of street drugs. Do not share needles with anyone. Ask for help if you need support or instructions about stopping the use of drugs.   High blood pressure causes heart disease and increases the risk of stroke. Your blood pressure should be checked at least yearly.  Ongoing high blood pressure should be treated with medicines if weight loss and exercise do not work.   If you are 59-8 years old, ask your health care provider if you should take aspirin to prevent strokes.   Diabetes screening involves taking a blood sample to check your fasting blood sugar level. This should be done once every 3 years, after age 16, if you are within normal weight and without risk factors for diabetes. Testing should be considered at a younger age or be carried out more frequently if you are overweight and have at least 1 risk factor for diabetes.   Breast cancer screening is essential preventive care for women. You should practice "breast self-awareness."  This means understanding the normal appearance and feel of your breasts and may include breast self-examination.  Any changes detected, no matter how small, should be reported to a health care provider.  Women in their 78s and 30s should have a clinical breast exam (CBE) by a health care provider as part of a regular health exam every 1 to 3 years.  After age 51, women should have a CBE every year.  Starting at age 75, women should consider having a mammogram (breast X-ray test) every year.  Women who have a family history of breast cancer should talk to their health care provider about genetic screening.  Women at a high risk of breast cancer should talk to their health care providers about having an MRI and a mammogram every year.   -Breast cancer gene (BRCA)-related cancer risk assessment is recommended for women who have family members with BRCA-related cancers. BRCA-related cancers include breast, ovarian, tubal, and peritoneal cancers.  Having family members with these cancers may be associated with an increased risk for harmful changes (mutations) in the breast cancer genes BRCA1 and BRCA2. Results of the assessment will determine the need for genetic counseling and BRCA1 and BRCA2 testing.   The Pap test is a screening test for cervical cancer. A Pap test can show cell changes on the cervix that might become cervical cancer if left untreated. A Pap test is a procedure in which cells are obtained and examined from the lower end of the uterus (cervix).   - Women should have a Pap test starting at age 42.   - Between ages 30 and 40, Pap tests should be repeated every 2 years.   - Beginning at age 7, you should have a Pap test every 3 years as long as the past 3 Pap tests have been normal.   -  Some women have medical problems that increase the chance of getting cervical cancer. Talk to your health care provider about these problems. It is especially important to talk to your health care provider if a new problem develops soon after your last Pap test. In these cases, your health care provider may recommend more frequent screening and Pap tests.   - The above recommendations are the same for women who have or have not gotten the vaccine for human papillomavirus (HPV).   - If you had a hysterectomy for a problem that was not cancer or a condition that could lead to cancer, then you no longer need Pap tests. Even if you no longer need a Pap test, a regular exam is a good idea to make sure no other problems are starting.   - If you are between ages 44 and 55 years, and you have had normal Pap tests going back 10 years, you no longer need Pap tests. Even if you no longer need a Pap test, a regular exam is a good idea to make sure no other problems are starting.   - If you have had past treatment for cervical cancer or a condition that could lead to cancer, you need Pap tests and screening for cancer for at least 20 years after your  treatment.   - If Pap tests have been discontinued, risk factors (such as a new sexual partner) need to be reassessed to determine if screening should be resumed.   - The HPV test is an additional test that may be used for cervical cancer screening. The HPV test looks for the virus that can cause the cell changes on the cervix. The cells collected during the Pap test can be tested for HPV. The HPV test could be used to screen women aged 32 years and older, and should be used in women of any age who have unclear Pap test results. After the age of 54, women should have HPV testing at the same frequency as a Pap test.   Colorectal cancer can be detected and often prevented. Most routine colorectal cancer screening begins at the age of 11 years and continues through age 45 years. However, your health care provider may recommend screening at an earlier age if you have risk factors for colon cancer. On a yearly basis, your health care provider may provide home test kits to check for hidden blood in the stool.  Use of a small camera at the end of a tube, to directly examine the colon (sigmoidoscopy or colonoscopy), can detect the earliest forms of colorectal cancer. Talk to your health care provider about this at age 14, when routine screening begins. Direct exam of the colon should be repeated every 5 -10 years through age 44 years, unless early forms of pre-cancerous polyps or small growths are found.   People who are at an increased risk for hepatitis B should be screened for this virus. You are considered at high risk for hepatitis B if:  -You were born in a country where hepatitis B occurs often. Talk with your health care provider about which countries are considered high risk.  - Your parents were born in a high-risk country and you have not received a shot to protect against hepatitis B (hepatitis B vaccine).  - You have HIV or AIDS.  - You use needles to inject street drugs.  - You live with, or  have sex with, someone who has Hepatitis B.  - You get hemodialysis  treatment.  - You take certain medicines for conditions like cancer, organ transplantation, and autoimmune conditions.   Hepatitis C blood testing is recommended for all people born from [birth year] through 1965 and any individual with known risks for hepatitis C.   Practice safe sex. Use condoms and avoid high-risk sexual practices to reduce the spread of sexually transmitted infections (STIs). STIs include gonorrhea, chlamydia, syphilis, trichomonas, herpes, HPV, and human immunodeficiency virus (HIV). Herpes, HIV, and HPV are viral illnesses that have no cure. They can result in disability, cancer, and death. Sexually active women aged 58 years and younger should be checked for chlamydia. Older women with new or multiple partners should also be tested for chlamydia. Testing for other STIs is recommended if you are sexually active and at increased risk.   Osteoporosis is a disease in which the bones lose minerals and strength with aging. This can result in serious bone fractures or breaks. The risk of osteoporosis can be identified using a bone density scan. Women ages 59 years and over and women at risk for fractures or osteoporosis should discuss screening with their health care providers. Ask your health care provider whether you should take a calcium supplement or vitamin D to There are also several preventive steps women can take to avoid osteoporosis and resulting fractures or to keep osteoporosis from worsening. -->Recommendations include:  Eat a balanced diet high in fruits, vegetables, calcium, and vitamins.  Get enough calcium. The recommended total intake of is 1,200 mg daily; for best absorption, if taking supplements, divide doses into 250-500 mg doses throughout the day. Of the two types of calcium, calcium carbonate is best absorbed when taken with food but calcium citrate can be taken on an empty stomach.  Get enough  vitamin D. [employer] and the [place] recommend at least 1,000 IU per day for women age 53 and over who are at risk of vitamin D deficiency. Vitamin D deficiency can be caused by inadequate sun exposure (for example, those who live in [place]).  Avoid alcohol and smoking. Heavy alcohol intake (more than 7 drinks per week) increases the risk of falls and hip fracture and women smokers tend to lose bone more rapidly and have lower bone mass than nonsmokers. Stopping smoking is one of the most important changes women can make to improve their health and decrease risk for disease.  Be physically active every day. Weight-bearing exercise (for example, fast walking, hiking, jogging, and weight training) may strengthen bones or slow the rate of bone loss that comes with aging. Balancing and muscle-strengthening exercises can reduce the risk of falling and fracture.  Consider therapeutic medications. Currently, several types of effective drugs are available. Healthcare providers can recommend the type most appropriate for each woman.  Eliminate environmental factors that may contribute to accidents. Falls cause nearly 90% of all osteoporotic fractures, so reducing this risk is an important bone-health strategy. Measures include ample lighting, removing obstructions to walking, using nonskid rugs on floors, and placing mats and/or grab bars in showers.  Be aware of medication side effects. Some common medicines make bones weaker. These include a type of steroid drug called glucocorticoids used for arthritis and asthma, some antiseizure drugs, certain sleeping pills, treatments for endometriosis, and some cancer drugs. An overactive thyroid gland or using too much thyroid hormone for an underactive thyroid can also be a problem. If you are taking these medicines, talk to your doctor about what you can do to help protect your bones.reduce  the rate of osteoporosis.    Menopause can be  associated with physical symptoms and risks. Hormone replacement therapy is available to decrease symptoms and risks. You should talk to your health care provider about whether hormone replacement therapy is right for you.   Use sunscreen. Apply sunscreen liberally and repeatedly throughout the day. You should seek shade when your shadow is shorter than you. Protect yourself by wearing long sleeves, pants, a wide-brimmed hat, and sunglasses year round, whenever you are outdoors.   Once a month, do a whole body skin exam, using a mirror to look at the skin on your back. Tell your health care provider of new moles, moles that have irregular borders, moles that are larger than a pencil eraser, or moles that have changed in shape or color.   -Stay current with required vaccines (immunizations).   Influenza vaccine. All adults should be immunized every year.  Tetanus, diphtheria, and acellular pertussis (Td, Tdap) vaccine. Pregnant women should receive 1 dose of Tdap vaccine during each pregnancy. The dose should be obtained regardless of the length of time since the last dose. Immunization is preferred during the 27th 36th week of gestation. An adult who has not previously received Tdap or who does not know her vaccine status should receive 1 dose of Tdap. This initial dose should be followed by tetanus and diphtheria toxoids (Td) booster doses every 10 years. Adults with an unknown or incomplete history of completing a 3-dose immunization series with Td-containing vaccines should begin or complete a primary immunization series including a Tdap dose. Adults should receive a Td booster every 10 years.  Varicella vaccine. An adult without evidence of immunity to varicella should receive 2 doses or a second dose if she has previously received 1 dose. Pregnant females who do not have evidence of immunity should receive the first dose after pregnancy. This first dose should be obtained before leaving the  health care facility. The second dose should be obtained 4 8 weeks after the first dose.  Human papillomavirus (HPV) vaccine. Females aged 38 26 years who have not received the vaccine previously should obtain the 3-dose series. The vaccine is not recommended for use in pregnant females. However, pregnancy testing is not needed before receiving a dose. If a female is found to be pregnant after receiving a dose, no treatment is needed. In that case, the remaining doses should be delayed until after the pregnancy. Immunization is recommended for any person with an immunocompromised condition through the age of 84 years if she did not get any or all doses earlier. During the 3-dose series, the second dose should be obtained 4 8 weeks after the first dose. The third dose should be obtained 24 weeks after the first dose and 16 weeks after the second dose.  Zoster vaccine. One dose is recommended for adults aged 57 years or older unless certain conditions are present.  Measles, mumps, and rubella (MMR) vaccine. Adults born before 50 generally are considered immune to measles and mumps. Adults born in [birth year] or later should have 1 or more doses of MMR vaccine unless there is a contraindication to the vaccine or there is laboratory evidence of immunity to each of the three diseases. A routine second dose of MMR vaccine should be obtained at least 28 days after the first dose for students attending postsecondary schools, health care workers, or international travelers. People who received inactivated measles vaccine or an unknown type of measles vaccine during 1963 1967 should receive  2 doses of MMR vaccine. People who received inactivated mumps vaccine or an unknown type of mumps vaccine before 1979 and are at high risk for mumps infection should consider immunization with 2 doses of MMR vaccine. For females of childbearing age, rubella immunity should be determined. If there is no evidence of immunity, females who  are not pregnant should be vaccinated. If there is no evidence of immunity, females who are pregnant should delay immunization until after pregnancy. Unvaccinated health care workers born before [birth year] who lack laboratory evidence of measles, mumps, or rubella immunity or laboratory confirmation of disease should consider measles and mumps immunization with 2 doses of MMR vaccine or rubella immunization with 1 dose of MMR vaccine.  Pneumococcal 13-valent conjugate (PCV13) vaccine. When indicated, a person who is uncertain of her immunization history and has no record of immunization should receive the PCV13 vaccine. An adult aged 62 years or older who has certain medical conditions and has not been previously immunized should receive 1 dose of PCV13 vaccine. This PCV13 should be followed with a dose of pneumococcal polysaccharide (PPSV23) vaccine. The PPSV23 vaccine dose should be obtained at least 8 weeks after the dose of PCV13 vaccine. An adult aged 70 years or older who has certain medical conditions and previously received 1 or more doses of PPSV23 vaccine should receive 1 dose of PCV13. The PCV13 vaccine dose should be obtained 1 or more years after the last PPSV23 vaccine dose.  Pneumococcal polysaccharide (PPSV23) vaccine. When PCV13 is also indicated, PCV13 should be obtained first. All adults aged 72 years and older should be immunized. An adult younger than age 49 years who has certain medical conditions should be immunized. Any person who resides in a nursing home or long-term care facility should be immunized. An adult smoker should be immunized. People with an immunocompromised condition and certain other conditions should receive both PCV13 and PPSV23 vaccines. People with human immunodeficiency virus (HIV) infection should be immunized as soon as possible after diagnosis. Immunization during chemotherapy or radiation therapy should be avoided. Routine use of PPSV23 vaccine is not recommended for  American Indians, [place] Natives, or people younger than 65 years unless there are medical conditions that require PPSV23 vaccine. When indicated, people who have unknown immunization and have no record of immunization should receive PPSV23 vaccine. One-time revaccination 5 years after the first dose of PPSV23 is recommended for people aged 50 64 years who have chronic kidney failure, nephrotic syndrome, asplenia, or immunocompromised conditions. People who received 1 2 doses of PPSV23 before age 68 years should receive another dose of PPSV23 vaccine at age 53 years or later if at least 5 years have passed since the previous dose. Doses of PPSV23 are not needed for people immunized with PPSV23 at or after age 14 years.  Meningococcal vaccine. Adults with asplenia or persistent complement component deficiencies should receive 2 doses of quadrivalent meningococcal conjugate (MenACWY-D) vaccine. The doses should be obtained at least 2 months apart. Microbiologists working with certain meningococcal bacteria, [employer] recruits, people at risk during an outbreak, and people who travel to or live in countries with a high rate of meningitis should be immunized. A first-year college student up through age 45 years who is living in a residence hall should receive a dose if she did not receive a dose on or after her 16th birthday. Adults who have certain high-risk conditions should receive one or more doses of vaccine.  Hepatitis A vaccine. Adults who wish to be  protected from this disease, have certain high-risk conditions, work with hepatitis A-infected animals, work in hepatitis A research labs, or travel to or work in countries with a high rate of hepatitis A should be immunized. Adults who were previously unvaccinated and who anticipate close contact with an international adoptee during the first 60 days after arrival in the [place] from a country with a high rate of hepatitis A should be  immunized.  Hepatitis B vaccine.  Adults who wish to be protected from this disease, have certain high-risk conditions, may be exposed to blood or other infectious body fluids, are household contacts or sex partners of hepatitis B positive people, are clients or workers in certain care facilities, or travel to or work in countries with a high rate of hepatitis B should be immunized.  Haemophilus influenzae type b (Hib) vaccine. A previously unvaccinated person with asplenia or sickle cell disease or having a scheduled splenectomy should receive 1 dose of Hib vaccine. Regardless of previous immunization, a recipient of a hematopoietic stem cell transplant should receive a 3-dose series 6 12 months after her successful transplant. Hib vaccine is not recommended for adults with HIV infection.  Preventive Services / Frequency Ages 39 to 39years  Blood pressure check.** / Every 1 to 2 years.  Lipid and cholesterol check.** / Every 5 years beginning at age 68.  Clinical breast exam.** / Every 3 years for women in their 85s and 38s.  BRCA-related cancer risk assessment.** / For women who have family members with a BRCA-related cancer (breast, ovarian, tubal, or peritoneal cancers).  Pap test.** / Every 2 years from ages 41 through 84. Every 3 years starting at age 46 through age 37 or 7 with a history of 3 consecutive normal Pap tests.  HPV screening.** / Every 3 years from ages 13 through ages 15 to 20 with a history of 3 consecutive normal Pap tests.  Hepatitis C blood test.** / For any individual with known risks for hepatitis C.  Skin self-exam. / Monthly.  Influenza vaccine. / Every year.  Tetanus, diphtheria, and acellular pertussis (Tdap, Td) vaccine.** / Consult your health care provider. Pregnant women should receive 1 dose of Tdap vaccine during each pregnancy. 1 dose of Td every 10 years.  Varicella vaccine.** / Consult your health care provider. Pregnant females who do not have  evidence of immunity should receive the first dose after pregnancy.  HPV vaccine. / 3 doses over 6 months, if 81 and younger. The vaccine is not recommended for use in pregnant females. However, pregnancy testing is not needed before receiving a dose.  Measles, mumps, rubella (MMR) vaccine.** / You need at least 1 dose of MMR if you were born in 1957 or later. You may also need a 2nd dose. For females of childbearing age, rubella immunity should be determined. If there is no evidence of immunity, females who are not pregnant should be vaccinated. If there is no evidence of immunity, females who are pregnant should delay immunization until after pregnancy.  Pneumococcal 13-valent conjugate (PCV13) vaccine.** / Consult your health care provider.  Pneumococcal polysaccharide (PPSV23) vaccine.** / 1 to 2 doses if you smoke cigarettes or if you have certain conditions.  Meningococcal vaccine.** / 1 dose if you are age 29 to 87 years and a Market researcher living in a residence hall, or have one of several medical conditions, you need to get vaccinated against meningococcal disease. You may also need additional booster doses.  Hepatitis A  vaccine.** / Consult your health care provider.  Hepatitis B vaccine.** / Consult your health care provider.  Haemophilus influenzae type b (Hib) vaccine.** / Consult your health care provider.  Ages 48 to 64years  Blood pressure check.** / Every 1 to 2 years.  Lipid and cholesterol check.** / Every 5 years beginning at age 29 years.  Lung cancer screening. / Every year if you are aged 61 80 years and have a 30-pack-year history of smoking and currently smoke or have quit within the past 15 years. Yearly screening is stopped once you have quit smoking for at least 15 years or develop a health problem that would prevent you from having lung cancer treatment.  Clinical breast exam.** / Every year after age 29 years.  BRCA-related cancer risk  assessment.** / For women who have family members with a BRCA-related cancer (breast, ovarian, tubal, or peritoneal cancers).  Mammogram.** / Every year beginning at age 75 years and continuing for as long as you are in good health. Consult with your health care provider.  Pap test.** / Every 3 years starting at age 31 years through age 18 or 8 years with a history of 3 consecutive normal Pap tests.  HPV screening.** / Every 3 years from ages 43 years through ages 39 to 58 years with a history of 3 consecutive normal Pap tests.  Fecal occult blood test (FOBT) of stool. / Every year beginning at age 68 years and continuing until age 70 years. You may not need to do this test if you get a colonoscopy every 10 years.  Flexible sigmoidoscopy or colonoscopy.** / Every 5 years for a flexible sigmoidoscopy or every 10 years for a colonoscopy beginning at age 71 years and continuing until age 38 years.  Hepatitis C blood test.** / For all people born from [birth year] through 1967 and any individual with known risks for hepatitis C.  Skin self-exam. / Monthly.  Influenza vaccine. / Every year.  Tetanus, diphtheria, and acellular pertussis (Tdap/Td) vaccine.** / Consult your health care provider. Pregnant women should receive 1 dose of Tdap vaccine during each pregnancy. 1 dose of Td every 10 years.  Varicella vaccine.** / Consult your health care provider. Pregnant females who do not have evidence of immunity should receive the first dose after pregnancy.  Zoster vaccine.** / 1 dose for adults aged 52 years or older.  Measles, mumps, rubella (MMR) vaccine.** / You need at least 1 dose of MMR if you were born in 1957 or later. You may also need a 2nd dose. For females of childbearing age, rubella immunity should be determined. If there is no evidence of immunity, females who are not pregnant should be vaccinated. If there is no evidence of immunity, females who are pregnant should delay immunization until  after pregnancy.  Pneumococcal 13-valent conjugate (PCV13) vaccine.** / Consult your health care provider.  Pneumococcal polysaccharide (PPSV23) vaccine.** / 1 to 2 doses if you smoke cigarettes or if you have certain conditions.  Meningococcal vaccine.** / Consult your health care provider.  Hepatitis A vaccine.** / Consult your health care provider.  Hepatitis B vaccine.** / Consult your health care provider.  Haemophilus influenzae type b (Hib) vaccine.** / Consult your health care provider.  Ages 67 years and over  Blood pressure check.** / Every 1 to 2 years.  Lipid and cholesterol check.** / Every 5 years beginning at age 67 years.  Lung cancer screening. / Every year if you are aged 43 80 years and have  a 30-pack-year history of smoking and currently smoke or have quit within the past 15 years. Yearly screening is stopped once you have quit smoking for at least 15 years or develop a health problem that would prevent you from having lung cancer treatment.  Clinical breast exam.** / Every year after age 37 years.  BRCA-related cancer risk assessment.** / For women who have family members with a BRCA-related cancer (breast, ovarian, tubal, or peritoneal cancers).  Mammogram.** / Every year beginning at age 48 years and continuing for as long as you are in good health. Consult with your health care provider.  Pap test.** / Every 3 years starting at age 74 years through age 37 or 45 years with 3 consecutive normal Pap tests. Testing can be stopped between 65 and 70 years with 3 consecutive normal Pap tests and no abnormal Pap or HPV tests in the past 10 years.  HPV screening.** / Every 3 years from ages 53 years through ages 49 or 65 years with a history of 3 consecutive normal Pap tests. Testing can be stopped between 65 and 70 years with 3 consecutive normal Pap tests and no abnormal Pap or HPV tests in the past 10 years.  Fecal occult blood test (FOBT) of stool. / Every year  beginning at age 75 years and continuing until age 18 years. You may not need to do this test if you get a colonoscopy every 10 years.  Flexible sigmoidoscopy or colonoscopy.** / Every 5 years for a flexible sigmoidoscopy or every 10 years for a colonoscopy beginning at age 6 years and continuing until age 60 years.  Hepatitis C blood test.** / For all people born from [birth year] through 1967 and any individual with known risks for hepatitis C.  Osteoporosis screening.** / A one-time screening for women ages 59 years and over and women at risk for fractures or osteoporosis.  Skin self-exam. / Monthly.  Influenza vaccine. / Every year.  Tetanus, diphtheria, and acellular pertussis (Tdap/Td) vaccine.** / 1 dose of Td every 10 years.  Varicella vaccine.** / Consult your health care provider.  Zoster vaccine.** / 1 dose for adults aged 23 years or older.  Pneumococcal 13-valent conjugate (PCV13) vaccine.** / Consult your health care provider.  Pneumococcal polysaccharide (PPSV23) vaccine.** / 1 dose for all adults aged 74 years and older.  Meningococcal vaccine.** / Consult your health care provider.  Hepatitis A vaccine.** / Consult your health care provider.  Hepatitis B vaccine.** / Consult your health care provider.  Haemophilus influenzae type b (Hib) vaccine.** / Consult your health care provider. ** Family history and personal history of risk and conditions may change your health care provider's recommendations. Document Released: 09/22/2001 Document Revised: 05/17/2013  [facility] Patient Information 2014 [place], [place].   EXERCISE AND DIET:  We recommended that you start or continue a regular exercise program for good health. Regular exercise means any activity that makes your heart beat faster and makes you sweat.  We recommend exercising at least 30 minutes per day at least 3 days a week, preferably 5.  We also recommend a diet low in fat and sugar / carbohydrates.  Inactivity,  poor dietary choices and obesity can cause diabetes, heart attack, stroke, and kidney damage, among others.     ALCOHOL AND SMOKING:  Women should limit their alcohol intake to no more than 7 drinks/beers/glasses of wine (combined, not each!) per week. Moderation of alcohol intake to this level decreases your risk of breast cancer and liver damage.  (  And of course, no recreational drugs are part of a healthy lifestyle.)  Also, you should not be smoking at all or even being exposed to second hand smoke. Most people know smoking can cause cancer, and various heart and lung diseases, but did you know it also contributes to weakening of your bones?  Aging of your skin?  Yellowing of your teeth and nails?   CALCIUM AND VITAMIN D:  Adequate intake of calcium and Vitamin D are recommended.  The recommendations for exact amounts of these supplements seem to change often, but generally speaking 600 mg of calcium (either carbonate or citrate) and 800 units of Vitamin D per day seems prudent. Certain women may benefit from higher intake of Vitamin D.  If you are among these women, your doctor will have told you during your visit.     PAP SMEARS:  Pap smears, to check for cervical cancer or precancers,  have traditionally been done yearly, although recent scientific advances have shown that most women can have pap smears less often.  However, every woman still should have a physical exam from her gynecologist or primary care physician every year. It will include a breast check, inspection of the vulva and vagina to check for abnormal growths or skin changes, a visual exam of the cervix, and then an exam to evaluate the size and shape of the uterus and ovaries.  And after 64 years of age, a rectal exam is indicated to check for rectal cancers. We will also provide age appropriate advice regarding health maintenance, like when you should have certain vaccines, screening for sexually transmitted diseases, bone density  testing, colonoscopy, mammograms, etc.    MAMMOGRAMS:  All women over 29 years old should have a yearly mammogram. Many facilities now offer a "3D" mammogram, which may cost around $50 extra out of pocket. If possible,  we recommend you accept the option to have the 3D mammogram performed.  It both reduces the number of women who will be called back for extra views which then turn out to be normal, and it is better than the routine mammogram at detecting truly abnormal areas.     COLONOSCOPY:  Colonoscopy to screen for colon cancer is recommended for all women at age 9.  We know, you hate the idea of the prep.  We agree, BUT, having colon cancer and not knowing it is worse!!  Colon cancer so often starts as a polyp that can be seen and removed at colonscopy, which can quite literally save your life!  And if your first colonoscopy is normal and you have no family history of colon cancer, most women don't have to have it again for 10 years.  Once every ten years, you can do something that may end up saving your life, right?  We will be happy to help you get it scheduled when you are ready.  Be sure to check your insurance coverage so you understand how much it will cost.  It may be covered as a preventative service at no cost, but you should check your particular policy.

## 2017-10-05 LAB — CBC WITH DIFFERENTIAL/PLATELET
BASOS: 0 %
Basophils Absolute: 0 10*3/uL (ref 0.0–0.2)
EOS (ABSOLUTE): 0.1 10*3/uL (ref 0.0–0.4)
EOS: 2 %
HEMATOCRIT: 39.2 % (ref 34.0–46.6)
Hemoglobin: 13 g/dL (ref 11.1–15.9)
IMMATURE GRANULOCYTES: 0 %
Immature Grans (Abs): 0 10*3/uL (ref 0.0–0.1)
LYMPHS: 36 %
Lymphocytes Absolute: 2.8 10*3/uL (ref 0.7–3.1)
MCH: 30.8 pg (ref 26.6–33.0)
MCHC: 33.2 g/dL (ref 31.5–35.7)
MCV: 93 fL (ref 79–97)
Monocytes Absolute: 0.5 10*3/uL (ref 0.1–0.9)
Monocytes: 7 %
NEUTROS PCT: 55 %
Neutrophils Absolute: 4.4 10*3/uL (ref 1.4–7.0)
PLATELETS: 292 10*3/uL (ref 150–379)
RBC: 4.22 x10E6/uL (ref 3.77–5.28)
RDW: 13.5 % (ref 12.3–15.4)
WBC: 7.9 10*3/uL (ref 3.4–10.8)

## 2017-10-05 LAB — HEMOGLOBIN A1C
ESTIMATED AVERAGE GLUCOSE: 126 mg/dL
HEMOGLOBIN A1C: 6 % — AB (ref 4.8–5.6)

## 2017-10-05 LAB — COMPREHENSIVE METABOLIC PANEL
ALBUMIN: 4.4 g/dL (ref 3.6–4.8)
ALK PHOS: 64 IU/L (ref 39–117)
ALT: 14 IU/L (ref 0–32)
AST: 18 IU/L (ref 0–40)
Albumin/Globulin Ratio: 1.4 (ref 1.2–2.2)
BUN/Creatinine Ratio: 14 (ref 12–28)
BUN: 11 mg/dL (ref 8–27)
Bilirubin Total: 0.4 mg/dL (ref 0.0–1.2)
CO2: 24 mmol/L (ref 20–29)
CREATININE: 0.81 mg/dL (ref 0.57–1.00)
Calcium: 9.6 mg/dL (ref 8.7–10.3)
Chloride: 98 mmol/L (ref 96–106)
GFR calc Af Amer: 89 mL/min/{1.73_m2} (ref >59)
GFR calc non Af Amer: 78 mL/min/{1.73_m2} (ref >59)
GLUCOSE: 80 mg/dL (ref 65–99)
Globulin, Total: 3.1 g/dL (ref 1.5–4.5)
Potassium: 4.2 mmol/L (ref 3.5–5.2)
Sodium: 140 mmol/L (ref 134–144)
Total Protein: 7.5 g/dL (ref 6.0–8.5)

## 2017-10-05 LAB — LIPID PANEL
Chol/HDL Ratio: 3.4 ratio (ref 0.0–4.4)
Cholesterol, Total: 212 mg/dL — ABNORMAL HIGH (ref 100–199)
HDL: 63 mg/dL (ref >39)
LDL Calculated: 136 mg/dL — ABNORMAL HIGH (ref 0–99)
TRIGLYCERIDES: 64 mg/dL (ref 0–149)
VLDL Cholesterol Cal: 13 mg/dL (ref 5–40)

## 2017-10-05 LAB — TSH: TSH: 1.96 u[IU]/mL (ref 0.450–4.500)

## 2017-10-05 LAB — VITAMIN D 25 HYDROXY (VIT D DEFICIENCY, FRACTURES): Vit D, 25-Hydroxy: 97.4 ng/mL (ref 30.0–100.0)

## 2017-10-05 LAB — VITAMIN B12: Vitamin B-12: 503 pg/mL (ref 232–1245)

## 2017-11-01 ENCOUNTER — Other Ambulatory Visit: Payer: Self-pay

## 2017-11-01 DIAGNOSIS — Z1211 Encounter for screening for malignant neoplasm of colon: Secondary | ICD-10-CM

## 2017-11-01 LAB — POC HEMOCCULT BLD/STL (HOME/3-CARD/SCREEN)
FECAL OCCULT BLD: NEGATIVE
FECAL OCCULT BLD: NEGATIVE
Fecal Occult Blood, POC: NEGATIVE

## 2017-11-02 ENCOUNTER — Ambulatory Visit (INDEPENDENT_AMBULATORY_CARE_PROVIDER_SITE_OTHER): Payer: BC Managed Care – PPO | Admitting: Orthopaedic Surgery

## 2017-11-02 NOTE — Progress Notes (Signed)
Pt was call and notified of results. Pt stated that she also saw results on MyChart. W.Bernita Beckstrom, CMA

## 2017-11-03 ENCOUNTER — Ambulatory Visit (INDEPENDENT_AMBULATORY_CARE_PROVIDER_SITE_OTHER): Payer: BC Managed Care – PPO | Admitting: Orthopaedic Surgery

## 2017-11-03 ENCOUNTER — Encounter (INDEPENDENT_AMBULATORY_CARE_PROVIDER_SITE_OTHER): Payer: Self-pay | Admitting: Orthopaedic Surgery

## 2017-11-03 DIAGNOSIS — M7541 Impingement syndrome of right shoulder: Secondary | ICD-10-CM

## 2017-11-03 NOTE — Progress Notes (Signed)
HPI: Jordan Wade returns today follow-up of her left shoulder status post subacromial injection by Dr. Ninfa Linden on 09/21/2017.  She states overall that the shoulder is much improved in regards to range of motion and pain.  She states she is having no real pain in the shoulder twinge of discomfort at times but this is rare.  She feels therapy is definitely helped.  Her twinge of discomfort is in the deltoid region when lifting or moving the shoulder certain way.  Denies any radicular symptoms down either arm.  Review of systems: Negative please see HPI.  Physical exam: General well-developed well-nourished female in no acute distress mood affect appropriate. Bilateral shoulders she has full range of both shoulders with forward flexion abduction.  She is able to reach up into the mid thoracic region with her left arm today.  She has 5 out of 5 strength with external and internal rotation against resistance and empty can test negative.  Management testing negative on the left.  Impression: Left shoulder impingement  Plan: She will continue to do home exercise program as taught by therapy.  Follow-up with [place] on as-needed basis pain returns.

## 2017-12-06 ENCOUNTER — Other Ambulatory Visit: Payer: Self-pay | Admitting: Family Medicine

## 2017-12-06 DIAGNOSIS — Z1231 Encounter for screening mammogram for malignant neoplasm of breast: Secondary | ICD-10-CM

## 2017-12-28 ENCOUNTER — Ambulatory Visit: Payer: BC Managed Care – PPO

## 2017-12-30 ENCOUNTER — Ambulatory Visit: Payer: BC Managed Care – PPO

## 2018-01-21 ENCOUNTER — Ambulatory Visit
Admission: RE | Admit: 2018-01-21 | Discharge: 2018-01-21 | Disposition: A | Discharge status: Home / Self Care | Payer: BC Managed Care – PPO | Source: Ambulatory Visit | Attending: Family Medicine | Admitting: Family Medicine

## 2018-01-21 DIAGNOSIS — Z1231 Encounter for screening mammogram for malignant neoplasm of breast: Secondary | ICD-10-CM

## 2018-01-31 ENCOUNTER — Encounter: Payer: Self-pay | Admitting: Adult Health

## 2018-01-31 ENCOUNTER — Encounter: Payer: Self-pay | Admitting: Internal Medicine

## 2018-01-31 ENCOUNTER — Ambulatory Visit: Payer: BC Managed Care – PPO | Admitting: Adult Health

## 2018-01-31 VITALS — BP 115/97 | HR 84 | Temp 98.8°F | Ht 61.5 in | Wt 134.7 lb

## 2018-01-31 DIAGNOSIS — R197 Diarrhea, unspecified: Secondary | ICD-10-CM | POA: Diagnosis not present

## 2018-01-31 DIAGNOSIS — Z800 Family history of malignant neoplasm of digestive organs: Secondary | ICD-10-CM

## 2018-01-31 MED ORDER — ONDANSETRON HCL 4 MG PO TABS: 4.0000 mg | ORAL_TABLET | Freq: Three times a day (TID) | ORAL | 0 refills | Status: DC | PRN

## 2018-01-31 NOTE — Assessment & Plan Note (Signed)
Last Colonoscopy 2015, repeat in 5 years Mother has had colon ca twice and strong maternal family hx of colon

## 2018-01-31 NOTE — Assessment & Plan Note (Signed)
We will call you when lab results are available. Follow BRAT Diet. Please use Zofran as needed. With your family history of colon cancer, recommend you be evaluated by GI specialist ASAP- referral placed. Please call us on Friday and let us know how you are feeling.

## 2018-01-31 NOTE — Progress Notes (Signed)
Subjective:    Patient ID: Jordan Wade, female    DOB: 05/05/1956, 62 y.o.   MRN: [id number]  HPI:  Jordan Wade presents with diarrhea that abruptly started last Thursday- 5-6 loose stools. She had 3-4 loose stools Friday, only 1-2 Saturday and "my belly was quite all day Sunday" then since midnight last night and 0500 this morning- she has had 5 loose/watery stools. She reports intermittent nausea without vomiting since Thursday. She reports low grade fever Thursday/friday, highest 101- afebrile since then She has generalized abdominal pain that is intermittent, increases just prior to BM and will subside after diarrhea, pain described as "cramping" and rated 6/10. She denies hematochezia/hematuria. She denies recent travel outside [place]. 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 She has sig family hx of colon ca- mother dx'd twice, first dx in her 54s Maternal grandfather and several cousins on mother's side with colon ca  Last colonoscopy was 2015, recommended to repeat in 5 years She does not have local GI specialist Wt and BP stable today  Patient Care Team    Relationship Specialty Notifications Start End  Mellody Dance, DO PCP - General Family Medicine  04/15/16   Mcarthur Rossetti, [place] Physician Orthopedic Surgery  04/15/16   Gavin Pound, [place] Physician Rheumatology  04/15/16   Jari Pigg, MD Consulting Physician Dermatology  04/15/16     Patient Active Problem List   Diagnosis Date Noted  . Diarrhea 01/31/2018  . Bronchitis 09/27/2017  . Impingement syndrome of right shoulder 09/21/2017  . Glucose intolerance (impaired glucose tolerance) A1c 5.9 January '18 08/21/2016  . Elevated LDL cholesterol level 08/10/2016  . History of osteopenia 04/17/2016  . Generalized OA 04/17/2016  . Family history of colon cancer 04/17/2016   . Family history of mixed hyperlipidemia 04/17/2016  . Family history of stroke or transient ischemic attack in mother 04/17/2016  . H/O bilateral hip replacements 04/17/2016     Past Medical History:  Diagnosis Date  . Arthritis   . PONV (postoperative nausea and vomiting)      Past Surgical History:  Procedure Laterality Date  . BILATERAL ANTERIOR TOTAL HIP ARTHROPLASTY Bilateral 11/04/2012   Procedure: BILATERAL ANTERIOR TOTAL HIP ARTHROPLASTY;  Surgeon: Mcarthur Rossetti, MD;  Location: [facility];  Service: Orthopedics;  Laterality: Bilateral;  . CESAREAN SECTION     x 2     Family History  Problem Relation Age of Onset  . Cancer Mother        colon  . Hyperlipidemia Mother   . Stroke Mother   . Hypertension Father   . Cancer Maternal Grandfather        colon  . Diabetes Maternal Grandfather      Social History   Substance and Sexual Activity  Drug Use No     Social History   Substance and Sexual Activity  Alcohol Use No     Social History   Tobacco Use  Smoking Status Never Smoker  Smokeless Tobacco Never Used     Outpatient Encounter Medications as of 01/31/2018  Medication Sig  . aspirin 81 MG chewable tablet Chew 81 mg by mouth daily.  . Calcium Carb-Cholecalciferol [phone] MG-UNIT CAPS Take 1 capsule by mouth 2 (two) times daily.   . Glucosamine-Chondroit-Vit C-Mn (GLUCOSAMINE 1500 COMPLEX PO) Take 1,500 mg by mouth daily.  Marland Kitchen  Omega-3 Fatty Acids (FISH OIL) 1000 MG CAPS Take 3 capsules (3,000 mg total) by mouth daily. (pt takes 3 1200 tabs d)  . ondansetron (ZOFRAN) 4 MG tablet Take 1 tablet (4 mg total) by mouth every 8 (eight) hours as needed for nausea or vomiting.   No facility-administered encounter medications on file as of 01/31/2018.     Allergies: Patient has no known allergies.  Body mass index is 25.04 kg/m.  Blood pressure (!) 115/97, pulse 84, temperature 98.8 F (37.1 C), temperature source Oral, height 5' 1.5" (1.562  m), weight 134 lb 11.2 oz (61.1 kg), SpO2 97 %.   Review of Systems  Constitutional: Positive for activity change, appetite change and fatigue. Negative for chills, diaphoresis, fever and unexpected weight change.  Eyes: Negative for visual disturbance.  Respiratory: Negative for cough, chest tightness, shortness of breath, wheezing and stridor.   Cardiovascular: Negative for chest pain, palpitations and leg swelling.  Gastrointestinal: Positive for abdominal pain, diarrhea and nausea. Negative for abdominal distention, blood in stool, constipation and vomiting.  Endocrine: Negative for cold intolerance, heat intolerance, polydipsia, polyphagia and polyuria.  Genitourinary: Negative for difficulty urinating, flank pain and hematuria.  Skin: Negative for color change, pallor, rash and wound.  Neurological: Negative for dizziness and headaches.  Hematological: Does not bruise/bleed easily.  Psychiatric/Behavioral: Positive for sleep disturbance.       Objective:   Physical Exam  Constitutional: She appears well-developed and well-nourished.  Appears quite fatigued, however well groomed   HENT:  Head: Normocephalic and atraumatic.  Right Ear: External ear normal.  Left Ear: External ear normal.  Nose: Nose normal.  Mouth/Throat: Oropharynx is clear and moist.  Eyes: Pupils are equal, round, and reactive to light. Conjunctivae and EOM are normal.  Neck: Normal range of motion. Neck supple.  Cardiovascular: Normal rate, regular rhythm, normal heart sounds and intact distal pulses.  No murmur heard. Pulmonary/Chest: Effort normal and breath sounds normal. No stridor. No respiratory distress. She has no wheezes. She has no rales.  Abdominal: Soft. Bowel sounds are normal. She exhibits no distension and no mass. There is no hepatosplenomegaly. There is generalized tenderness. There is no rigidity, no rebound, no guarding, no CVA tenderness, no tenderness at McBurney's point and negative  Murphy's sign. No hernia.  Lymphadenopathy:    She has no cervical adenopathy.  Skin: Skin is warm and dry. Capillary refill takes less than 2 seconds. No rash noted. She is not diaphoretic. No erythema. No pallor.  Psychiatric: She has a normal mood and affect. Her behavior is normal. Judgment and thought content normal.      Assessment & Plan:   1. Diarrhea, unspecified type   2. Family history of colon cancer     Diarrhea We will call you when lab results are available. Follow BRAT Diet. Please use Zofran as needed. With your family history of colon cancer, recommend you be evaluated by GI specialist ASAP- referral placed. Please call us on Friday and let us know how you are feeling.  Family history of colon cancer Last Colonoscopy 2015, repeat in 5 years Mother has had colon ca twice and strong maternal family hx of colon   FOLLOW-UP:  Return if symptoms worsen or fail to improve.

## 2018-01-31 NOTE — Patient Instructions (Addendum)
Diarrhea, Adult Diarrhea is frequent loose and watery bowel movements. Diarrhea can make you feel weak and cause you to become dehydrated. Dehydration can make you tired and thirsty, cause you to have a dry mouth, and decrease how often you urinate. Diarrhea typically lasts 2-3 days. However, it can last longer if it is a sign of something more serious. It is important to treat your diarrhea as told by your health care provider. Follow these instructions at home: Eating and drinking  Follow these recommendations as told by your health care provider:  Take an oral rehydration solution (ORS). This is a drink that is sold at pharmacies and retail stores.  Drink clear fluids, such as water, ice chips, diluted fruit juice, and low-calorie sports drinks.  Eat bland, easy-to-digest foods in small amounts as you are able. These foods include bananas, applesauce, rice, lean meats, toast, and crackers.  Avoid drinking fluids that contain a lot of sugar or caffeine, such as energy drinks, sports drinks, and soda.  Avoid alcohol.  Avoid spicy or fatty foods.  General instructions  Drink enough fluid to keep your urine clear or pale yellow.  Wash your hands often. If soap and water are not available, use hand sanitizer.  Make sure that all people in your household wash their hands well and often.  Take over-the-counter and prescription medicines only as told by your health care provider.  Rest at home while you recover.  Watch your condition for any changes.  Take a warm bath to relieve any burning or pain from frequent diarrhea episodes.  Keep all follow-up visits as told by your health care provider. This is important. Contact a health care provider if:  You have a fever.  Your diarrhea gets worse.  You have new symptoms.  You cannot keep fluids down.  You feel light-headed or dizzy.  You have a headache  You have muscle cramps. Get help right away if:  You have chest  pain.  You feel extremely weak or you faint.  You have bloody or black stools or stools that look like tar.  You have severe pain, cramping, or bloating in your abdomen.  You have trouble breathing or you are breathing very quickly.  Your heart is beating very quickly.  Your skin feels cold and clammy.  You feel confused.  You have signs of dehydration, such as: ? Dark urine, very little urine, or no urine. ? Cracked lips. ? Dry mouth. ? Sunken eyes. ? Sleepiness. ? Weakness. This information is not intended to replace advice given to you by your health care provider. Make sure you discuss any questions you have with your health care provider. Document Released: 07/17/2002 Document Revised: 12/05/2015 Document Reviewed: 04/02/2015 Elsevier Interactive Patient Education  [employer].  We will call you when lab results are available. Follow BRAT Diet. Please use Zofran as needed. With your family history of colon cancer, recommend you be evaluated by GI specialist ASAP- referral placed. Please call us on Friday and let us know how you are feeling. FEEL BETTER!

## 2018-02-01 LAB — COMPREHENSIVE METABOLIC PANEL
A/G RATIO: 1.3 (ref 1.2–2.2)
ALT: 11 IU/L (ref 0–32)
AST: 17 IU/L (ref 0–40)
Albumin: 3.9 g/dL (ref 3.6–4.8)
Alkaline Phosphatase: 55 IU/L (ref 39–117)
BUN/Creatinine Ratio: 13 (ref 12–28)
BUN: 10 mg/dL (ref 8–27)
Bilirubin Total: 0.3 mg/dL (ref 0.0–1.2)
CALCIUM: 9.2 mg/dL (ref 8.7–10.3)
CO2: 24 mmol/L (ref 20–29)
Chloride: 103 mmol/L (ref 96–106)
Creatinine, Ser: 0.79 mg/dL (ref 0.57–1.00)
GFR, EST AFRICAN AMERICAN: 92 mL/min/{1.73_m2} (ref >59)
GFR, EST NON AFRICAN AMERICAN: 80 mL/min/{1.73_m2} (ref >59)
Globulin, Total: 2.9 g/dL (ref 1.5–4.5)
Glucose: 98 mg/dL (ref 65–99)
POTASSIUM: 3.7 mmol/L (ref 3.5–5.2)
Sodium: 142 mmol/L (ref 134–144)
TOTAL PROTEIN: 6.8 g/dL (ref 6.0–8.5)

## 2018-02-04 ENCOUNTER — Telehealth: Payer: Self-pay | Admitting: Family Medicine

## 2018-02-04 NOTE — Telephone Encounter (Signed)
Patient saw Valetta Fuller for acute visit earlier in the week and was advised to call back at the end of the week for an update. Patient states she is feeling better but that the diarrhea is still there after eating and she hasn't been eating her normal portions to avoid having these episodes so now she feels a lack of energy. GI did call to set her up with an intial appt but that isn't until August. She wants to know is there anything can do in the mean time to help or any med that would help. Please advise

## 2018-02-07 NOTE — Telephone Encounter (Signed)
Please advise.  T. Mirela Parsley, CMA 

## 2018-02-08 NOTE — Telephone Encounter (Signed)
Good Morning Jordan Wade, Can you please call Jordan Wade and have her complete stool specimen. Can you please put in the orders and explain to the pt how to complete. Please tell her to continue to remain well hydrated and follow BRAT diet. Please also encourage her to be placed on "cancellation list" with GI in hopes of getting in sooner to establish at practice. Thanks! Valetta Fuller

## 2018-02-09 NOTE — Telephone Encounter (Signed)
Pt states that she is feeling better now and diarrhea has now resolved.  Pt states that she does not wish to do stool specimens right now and will just wait to see GI.  Instructed pt to call us back if symptoms redevelop and she has not seen GI yet.  Pt expressed understanding and is agreeable.  Charyl Bigger, CMA

## 2018-03-31 ENCOUNTER — Encounter: Payer: Self-pay | Admitting: Internal Medicine

## 2018-03-31 ENCOUNTER — Ambulatory Visit: Payer: BC Managed Care – PPO | Admitting: Internal Medicine

## 2018-03-31 VITALS — BP 120/62 | HR 80 | Ht 61.32 in | Wt 136.5 lb

## 2018-03-31 DIAGNOSIS — Z1211 Encounter for screening for malignant neoplasm of colon: Secondary | ICD-10-CM

## 2018-03-31 DIAGNOSIS — Z800 Family history of malignant neoplasm of digestive organs: Secondary | ICD-10-CM

## 2018-03-31 DIAGNOSIS — R197 Diarrhea, unspecified: Secondary | ICD-10-CM

## 2018-03-31 MED ORDER — ONDANSETRON HCL 4 MG PO TABS: ORAL_TABLET | ORAL | 0 refills | Status: DC

## 2018-03-31 MED ORDER — NA SULFATE-K SULFATE-MG SULF 17.5-3.13-1.6 GM/177ML PO SOLN: 1.0000 | Freq: Once | ORAL | 0 refills | Status: AC

## 2018-03-31 NOTE — Addendum Note (Signed)
Addended by: Audrea Muscat on: 03/31/2018 02:26 PM   Modules accepted: Orders

## 2018-03-31 NOTE — Patient Instructions (Addendum)
You have been scheduled for a colonoscopy. Please follow written instructions given to you at your visit today.  Please pick up your prep supplies at the pharmacy within the next 1-3 days. If you use inhalers (even only as needed), please bring them with you on the day of your procedure. Your physician has requested that you go to www.startemmi.com and enter the access code given to you at your visit today. This web site gives a general overview about your procedure. However, you should still follow specific instructions given to you by our office regarding your preparation for the procedure.   We have sent the following medications to your pharmacy for you to pick up at your convenience:  Zofran - take 1 tablet 20 minutes before drinking each prep.

## 2018-03-31 NOTE — Progress Notes (Signed)
HISTORY OF PRESENT ILLNESS:  Jordan Wade is a 62 y.o. female , retired high school Probation officer, who is sent today by her primary care provider regarding recent problems with acute diarrheal illness and the need for colonoscopy given her family history. Patient reports having had approximately 4 colonoscopies over the past 20 years. No polyps. Mother diagnosed with colon cancer in her 46s. Metachronous colon cancer thereafter. Also maternal grandfather and cousin with colon cancer. The patient is an only child. She was in her usual state of health until mid June when she developed abrupt diarrhea with abdominal cramping but no bleeding. Low-grade fever. Seen by her primary provider's office 01/31/2018. Normal comprehensive metabolic panel. She was treated with supportive measures and improved without recurrent problems. Has been well since early July. No active GI complaints at this time. She is due for her follow-up screening colonoscopy. Patient did tell me that she has had trouble with preps previously with nausea and vomiting.  REVIEW OF SYSTEMS:  All non-GI ROS negative unless otherwise stated in the history of present illness except for arthritis  Past Medical History:  Diagnosis Date  . Arthritis   . PONV (postoperative nausea and vomiting)     Past Surgical History:  Procedure Laterality Date  . BILATERAL ANTERIOR TOTAL HIP ARTHROPLASTY Bilateral 11/04/2012   Procedure: BILATERAL ANTERIOR TOTAL HIP ARTHROPLASTY;  Surgeon: Mcarthur Rossetti, MD;  Location: [facility];  Service: Orthopedics;  Laterality: Bilateral;  . CESAREAN SECTION     x 2    Social History Jordan Wade  reports that she has never smoked. She has never used smokeless tobacco. She reports that she does not drink alcohol or use drugs.  family history includes Breast cancer in her paternal aunt; Colon cancer in her cousin, maternal grandfather, and mother; Diabetes in her maternal grandfather; Heart attack in  her maternal grandfather; Heart disease in her maternal grandfather; Hyperlipidemia in her mother; Hypertension in her father; Stroke in her mother.  No Known Allergies     PHYSICAL EXAMINATION: Vital signs: BP 120/62 (BP Location: Left Arm, Patient Position: Sitting, Cuff Size: Normal)   Pulse 80   Ht 5' 1.32" (1.558 m) Comment: height measured without shoes  Wt 136 lb 8 oz (61.9 kg)   BMI 25.52 kg/m   Constitutional: generally well-appearing, no acute distress Psychiatric: alert and oriented x3, cooperative Eyes: extraocular movements intact, anicteric, conjunctiva pink Mouth: oral pharynx moist, no lesions Neck: supple no lymphadenopathy Cardiovascular: heart regular rate and rhythm, no murmur Lungs: clear to auscultation bilaterally Abdomen: soft, nontender, nondistended, no obvious ascites, no peritoneal signs, normal bowel sounds, no organomegaly Rectal:deferred until colonoscopy Extremities: no clubbing, cyanosis, or lower extremity edema bilaterally Skin: no lesions on visible extremities Neuro: No focal deficits. Cranial nerves intact  ASSESSMENT:  #1. Acute diarrheal illness. Self-limited. Almost certainly infectious. No further intervention required #2. Strong family history of colon cancer as outlined. Due for follow-up screening #3. History of nausea and vomiting with colonoscopy preparation  PLAN:  #1. Screening colonoscopy.The nature of the procedure, as well as the risks, benefits, and alternatives were carefully and thoroughly reviewed with the patient. Ample time for discussion and questions allowed. The patient understood, was satisfied, and agreed to proceed. #2. Prescribed Zofran 4 mg to be taken 20 minutes before each prep session

## 2018-05-16 ENCOUNTER — Encounter: Payer: Self-pay | Admitting: Internal Medicine

## 2018-05-27 ENCOUNTER — Encounter: Payer: Self-pay | Admitting: Internal Medicine

## 2018-05-27 ENCOUNTER — Ambulatory Visit (AMBULATORY_SURGERY_CENTER): Payer: BC Managed Care – PPO | Admitting: Internal Medicine

## 2018-05-27 VITALS — BP 129/70 | HR 54 | Temp 99.3°F | Resp 9 | Ht 61.0 in | Wt 136.0 lb

## 2018-05-27 DIAGNOSIS — Z1211 Encounter for screening for malignant neoplasm of colon: Secondary | ICD-10-CM

## 2018-05-27 DIAGNOSIS — D122 Benign neoplasm of ascending colon: Secondary | ICD-10-CM

## 2018-05-27 DIAGNOSIS — Z800 Family history of malignant neoplasm of digestive organs: Secondary | ICD-10-CM

## 2018-05-27 MED ORDER — SODIUM CHLORIDE 0.9 % IV SOLN: 500.0000 mL | Freq: Once | INTRAVENOUS | Status: DC

## 2018-05-27 NOTE — Op Note (Signed)
[facility] Patient Name: Jordan Wade Procedure Date: 05/27/2018 3:05 PM MRN: [id number] Endoscopist: Docia Chuck. Henrene Pastor , MD Age: 62 Referring MD:  Date of Birth: 10-22-1955 Gender: Female Account #: [id number] Procedure:                Colonoscopy with cold snare polypectomy x 1 Indications:              Screening in patient at increased risk: Colorectal                            cancer in mother before age 63, Colon cancer                            screening in patient at increased risk: Family                            history of colorectal cancer in multiple 2nd degree                            relatives multiple prior exams elsewhere reportedly                            negative Medicines:                Monitored Anesthesia Care Procedure:                Pre-Anesthesia Assessment:                           - Prior to the procedure, a History and Physical                            was performed, and patient medications and                            allergies were reviewed. The patient's tolerance of                            previous anesthesia was also reviewed. The risks                            and benefits of the procedure and the sedation                            options and risks were discussed with the patient.                            All questions were answered, and informed consent                            was obtained. Prior Anticoagulants: The patient has                            taken no previous anticoagulant or antiplatelet  agents. ASA Grade Assessment: II - A patient with                            mild systemic disease. After reviewing the risks                            and benefits, the patient was deemed in                            satisfactory condition to undergo the procedure.                           After obtaining informed consent, the colonoscope                            was passed under direct  vision. Throughout the                            procedure, the patient's blood pressure, pulse, and                            oxygen saturations were monitored continuously. The                            Model PCF-[id number]) scope was introduced                            through the anus and advanced to the the cecum,                            identified by appendiceal orifice and ileocecal                            valve. The ileocecal valve, appendiceal orifice,                            and rectum were photographed. The quality of the                            bowel preparation was excellent. The colonoscopy                            was performed without difficulty. The patient                            tolerated the procedure well. The bowel preparation                            used was SUPREP. Scope In: 3:13:26 PM Scope Out: 3:28:26 PM Scope Withdrawal Time: 0 hours 12 minutes 6 seconds  Total Procedure Duration: 0 hours 15 minutes 0 seconds  Findings:                 A 3 mm polyp was found in the ascending colon. The  polyp was removed with a cold snare. Resection and                            retrieval were complete.                           Internal hemorrhoids were found during retroflexion.                           The exam was otherwise without abnormality on                            direct and retroflexion views. Complications:            No immediate complications. Estimated blood loss:                            None. Estimated Blood Loss:     Estimated blood loss: none. Impression:               - One 3 mm polyp in the ascending colon, removed                            with a cold snare. Resected and retrieved.                           - Internal hemorrhoids.                           - The examination was otherwise normal on direct                            and retroflexion views. Recommendation:           - Repeat  colonoscopy in 5 years for surveillance.                           - Patient has a contact number available for                            emergencies. The signs and symptoms of potential                            delayed complications were discussed with the                            patient. Return to normal activities tomorrow.                            Written discharge instructions were provided to the                            patient.                           - Resume previous diet.                           -  Continue present medications.                           - Await pathology results. Docia Chuck. Henrene Pastor, MD 05/27/2018 3:33:15 PM This report has been signed electronically.

## 2018-05-27 NOTE — Progress Notes (Signed)
Called to room to assist during endoscopic procedure.  Patient ID and intended procedure confirmed with present staff. Received instructions for my participation in the procedure from the performing physician.  

## 2018-05-27 NOTE — Progress Notes (Signed)
Report given to PACU, vss 

## 2018-05-27 NOTE — Patient Instructions (Signed)
*   handout on polyps given *  YOU HAD AN ENDOSCOPIC PROCEDURE TODAY AT THE [facility]:   Refer to the procedure report that was given to you for any specific questions about what was found during the examination.  If the procedure report does not answer your questions, please call your gastroenterologist to clarify.  If you requested that your care partner not be given the details of your procedure findings, then the procedure report has been included in a sealed envelope for you to review at your convenience later.  YOU SHOULD EXPECT: Some feelings of bloating in the abdomen. Passage of more gas than usual.  Walking can help get rid of the air that was put into your GI tract during the procedure and reduce the bloating. If you had a lower endoscopy (such as a colonoscopy or flexible sigmoidoscopy) you may notice spotting of blood in your stool or on the toilet paper. If you underwent a bowel prep for your procedure, you may not have a normal bowel movement for a few days.  Please Note:  You might notice some irritation and congestion in your nose or some drainage.  This is from the oxygen used during your procedure.  There is no need for concern and it should clear up in a day or so.  SYMPTOMS TO REPORT IMMEDIATELY:   Following lower endoscopy (colonoscopy or flexible sigmoidoscopy):  Excessive amounts of blood in the stool  Significant tenderness or worsening of abdominal pains  Swelling of the abdomen that is new, acute  Fever of 100F or higher   For urgent or emergent issues, a gastroenterologist can be reached at any hour by calling [phone].   DIET:  We do recommend a small meal at first, but then you may proceed to your regular diet.  Drink plenty of fluids but you should avoid alcoholic beverages for 24 hours.  ACTIVITY:  You should plan to take it easy for the rest of today and you should NOT DRIVE or use heavy machinery until tomorrow (because of the sedation  medicines used during the test).    FOLLOW UP: Our staff will call the number listed on your records the next business day following your procedure to check on you and address any questions or concerns that you may have regarding the information given to you following your procedure. If we do not reach you, we will leave a message.  However, if you are feeling well and you are not experiencing any problems, there is no need to return our call.  We will assume that you have returned to your regular daily activities without incident.  If any biopsies were taken you will be contacted by phone or by letter within the next 1-3 weeks.  Please call us at [phone] if you have not heard about the biopsies in 3 weeks.    SIGNATURES/CONFIDENTIALITY: You and/or your care partner have signed paperwork which will be entered into your electronic medical record.  These signatures attest to the fact that that the information above on your After Visit Summary has been reviewed and is understood.  Full responsibility of the confidentiality of this discharge information lies with you and/or your care-partner. 

## 2018-05-30 ENCOUNTER — Telehealth: Payer: Self-pay | Admitting: *Deleted

## 2018-05-30 NOTE — Telephone Encounter (Signed)
  Follow up Call-  Call back number 05/27/2018  Post procedure Call Back phone  # [phone]  Permission to leave phone message Yes  Some recent data might be hidden     Patient questions:  Do you have a fever, pain , or abdominal swelling? No. Pain Score  0 *  Have you tolerated food without any problems? Yes.    Have you been able to return to your normal activities? Yes.    Do you have any questions about your discharge instructions: Diet   No. Medications  No. Follow up visit  No.  Do you have questions or concerns about your Care? No.  Actions: * If pain score is 4 or above: No action needed, pain <4.

## 2018-06-01 ENCOUNTER — Encounter: Payer: Self-pay | Admitting: Internal Medicine

## 2018-07-29 ENCOUNTER — Other Ambulatory Visit: Payer: Self-pay | Admitting: *Deleted

## 2018-07-29 ENCOUNTER — Encounter: Payer: Self-pay | Admitting: Neurology

## 2018-07-29 DIAGNOSIS — G5603 Carpal tunnel syndrome, bilateral upper limbs: Secondary | ICD-10-CM

## 2018-08-18 ENCOUNTER — Ambulatory Visit (INDEPENDENT_AMBULATORY_CARE_PROVIDER_SITE_OTHER): Payer: BC Managed Care – PPO | Admitting: Neurology

## 2018-08-18 DIAGNOSIS — G5603 Carpal tunnel syndrome, bilateral upper limbs: Secondary | ICD-10-CM

## 2018-08-18 NOTE — Procedures (Signed)
[facility]  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  NCV & EMG Findings: Extensive electrodiagnostic testing of the right upper extremity and additional studies of the left shows:  1. Bilateral median sensory responses show prolonged latency (R4.9, L4.0 ms) and on the right, amplitude is also reduced (9.2 V).  Bilateral ulnar sensory responses are within normal limits. 2. Bilateral median motor responses show prolonged latency (R5.3, L4.5 ms).  Bilateral ulnar motor responses are within normal limits. 3. There is no evidence of active or chronic motor axonal loss changes affecting any of the tested muscles.  Motor unit configuration and recruitment pattern is within normal limits.   Impression: 1. Right median neuropathy at or distal to the wrist, consistent with a clinical diagnosis of carpal tunnel syndrome, which is moderate-to-severe in degree electrically. 2. Left median neuropathy at or distal to the wrist, consistent with a clinical diagnosis of carpal tunnel syndrome, which is moderate in degree electrically.    ___________________________ Narda Amber, DO    Nerve Conduction Studies Anti Sensory Summary Table   Stim Site NR Peak (ms) Norm Peak (ms) P-T Amp (V) Norm P-T Amp  Left Median Anti Sensory (2nd Digit)  35C  Wrist    4.0 <3.8 16.6 >10  Right Median Anti Sensory (2nd Digit)  35C  Wrist    4.9 <3.8 9.2 >10  Left Ulnar Anti Sensory (5th Digit)  35C  Wrist    2.8 <3.2 17.5 >5  Right Ulnar Anti Sensory (5th Digit)  35C  Wrist    2.5 <3.2 19.3 >5   Motor Summary Table   Stim Site NR Onset  (ms) Norm Onset (ms) O-P Amp (mV) Norm O-P Amp Site1 Site2 Delta-0 (ms) Dist (cm) Vel (m/s) Norm Vel (m/s)  Left Median Motor (Abd Poll Brev)  35C  Wrist    4.5 <4.0 7.2 >5 Elbow Wrist 4.0 27.0 68 >50  Elbow    8.5  6.9         Right Median Motor (Abd Poll Brev)  35C  Wrist    5.3 <4.0 5.5 >5 Elbow Wrist 4.2 28.0 67 >50  Elbow    9.5  5.0         Left Ulnar Motor (Abd Dig Minimi)  35C  Wrist    1.7 <3.1 9.0 >7 B Elbow Wrist 3.5 22.0 63 >50  B Elbow    5.2  8.9  A Elbow B Elbow 1.6 10.0 63 >50  A Elbow    6.8  8.7         Right Ulnar Motor (Abd Dig Minimi)  35C  Wrist    1.4 <3.1 8.4 >7 B Elbow Wrist 3.6 22.0 61 >50  B Elbow    5.0  8.0  A Elbow B Elbow 1.8 10.0 56 >50  A Elbow    6.8  8.0          EMG   Side Muscle Ins Act Fibs Psw Fasc Number Recrt Dur Dur. Amp Amp. Poly Poly. Comment  Left 1stDorInt Nml Nml Nml Nml Nml Nml Nml Nml Nml Nml Nml Nml N/A  Left Abd  Poll Brev Nml Nml Nml Nml Nml Nml Nml Nml Nml Nml Nml Nml N/A  Left PronatorTeres Nml Nml Nml Nml Nml Nml Nml Nml Nml Nml Nml Nml N/A  Left Biceps Nml Nml Nml Nml Nml Nml Nml Nml Nml Nml Nml Nml N/A  Left Triceps Nml Nml Nml Nml Nml Nml Nml Nml Nml Nml Nml Nml N/A  Right 1stDorInt Nml Nml Nml Nml Nml Nml Nml Nml Nml Nml Nml Nml N/A  Right Abd Poll Brev Nml Nml Nml Nml Nml Nml Nml Nml Nml Nml Nml Nml N/A  Right PronatorTeres Nml Nml Nml Nml Nml Nml Nml Nml Nml Nml Nml Nml N/A  Right Biceps Nml Nml Nml Nml Nml Nml Nml Nml Nml Nml Nml Nml N/A  Right Triceps Nml Nml Nml Nml Nml Nml Nml Nml Nml Nml Nml Nml N/A  Right Deltoid Nml Nml Nml Nml Nml Nml Nml Nml Nml Nml Nml Nml N/A      Waveforms:

## 2018-09-29 ENCOUNTER — Other Ambulatory Visit: Payer: Self-pay | Admitting: Orthopedic Surgery

## 2018-10-03 ENCOUNTER — Other Ambulatory Visit: Payer: Self-pay | Admitting: Orthopedic Surgery

## 2018-10-10 ENCOUNTER — Encounter (HOSPITAL_BASED_OUTPATIENT_CLINIC_OR_DEPARTMENT_OTHER): Payer: Self-pay | Admitting: *Deleted

## 2018-10-10 ENCOUNTER — Other Ambulatory Visit: Payer: Self-pay

## 2018-10-18 ENCOUNTER — Ambulatory Visit (HOSPITAL_BASED_OUTPATIENT_CLINIC_OR_DEPARTMENT_OTHER): Payer: BC Managed Care – PPO | Admitting: Anesthesiology

## 2018-10-18 ENCOUNTER — Encounter (HOSPITAL_BASED_OUTPATIENT_CLINIC_OR_DEPARTMENT_OTHER)
Admission: RE | Disposition: A | Discharge status: Home / Self Care | Payer: Self-pay | Source: Home / Self Care | Attending: Orthopedic Surgery

## 2018-10-18 ENCOUNTER — Other Ambulatory Visit: Payer: Self-pay

## 2018-10-18 ENCOUNTER — Ambulatory Visit (HOSPITAL_BASED_OUTPATIENT_CLINIC_OR_DEPARTMENT_OTHER)
Admission: RE | Admit: 2018-10-18 | Discharge: 2018-10-18 | Disposition: A | Discharge status: Home / Self Care | Payer: BC Managed Care – PPO | Attending: Orthopedic Surgery | Admitting: Orthopedic Surgery

## 2018-10-18 ENCOUNTER — Encounter (HOSPITAL_BASED_OUTPATIENT_CLINIC_OR_DEPARTMENT_OTHER): Payer: Self-pay

## 2018-10-18 DIAGNOSIS — R200 Anesthesia of skin: Secondary | ICD-10-CM | POA: Diagnosis present

## 2018-10-18 DIAGNOSIS — Z96643 Presence of artificial hip joint, bilateral: Secondary | ICD-10-CM | POA: Insufficient documentation

## 2018-10-18 DIAGNOSIS — G5603 Carpal tunnel syndrome, bilateral upper limbs: Secondary | ICD-10-CM | POA: Insufficient documentation

## 2018-10-18 DIAGNOSIS — M65311 Trigger thumb, right thumb: Secondary | ICD-10-CM | POA: Diagnosis not present

## 2018-10-18 HISTORY — DX: Trigger thumb, right thumb: M65.311

## 2018-10-18 HISTORY — PX: TRIGGER FINGER RELEASE: SHX641

## 2018-10-18 HISTORY — DX: Carpal tunnel syndrome, right upper limb: G56.01

## 2018-10-18 HISTORY — PX: CARPAL TUNNEL RELEASE: SHX101

## 2018-10-18 SURGERY — CARPAL TUNNEL RELEASE
Anesthesia: Monitor Anesthesia Care | Site: Wrist | Laterality: Right

## 2018-10-18 MED ORDER — FENTANYL CITRATE (PF) 100 MCG/2ML IJ SOLN
INTRAMUSCULAR | Status: AC
  Filled 2018-10-18: qty 2

## 2018-10-18 MED ORDER — ACETAMINOPHEN 500 MG PO TABS: 1000.0000 mg | ORAL_TABLET | Freq: Once | ORAL | Status: DC | PRN

## 2018-10-18 MED ORDER — LIDOCAINE 2% (20 MG/ML) 5 ML SYRINGE
INTRAMUSCULAR | Status: AC
  Filled 2018-10-18: qty 5

## 2018-10-18 MED ORDER — MIDAZOLAM HCL 2 MG/2ML IJ SOLN
1.0000 mg | INTRAMUSCULAR | Status: DC | PRN
  Administered 2018-10-18: 1 mg via INTRAVENOUS

## 2018-10-18 MED ORDER — SCOPOLAMINE 1 MG/3DAYS TD PT72: 1.0000 | MEDICATED_PATCH | Freq: Once | TRANSDERMAL | Status: DC | PRN

## 2018-10-18 MED ORDER — PROPOFOL 500 MG/50ML IV EMUL
INTRAVENOUS | Status: DC | PRN
  Administered 2018-10-18: 50 ug/kg/min via INTRAVENOUS

## 2018-10-18 MED ORDER — FENTANYL CITRATE (PF) 100 MCG/2ML IJ SOLN: 25.0000 ug | INTRAMUSCULAR | Status: DC | PRN

## 2018-10-18 MED ORDER — ONDANSETRON HCL 4 MG/2ML IJ SOLN
INTRAMUSCULAR | Status: AC
  Filled 2018-10-18: qty 2

## 2018-10-18 MED ORDER — OXYCODONE HCL 5 MG PO TABS: 5.0000 mg | ORAL_TABLET | Freq: Once | ORAL | Status: DC | PRN

## 2018-10-18 MED ORDER — TRAMADOL HCL 50 MG PO TABS: 50.0000 mg | ORAL_TABLET | Freq: Four times a day (QID) | ORAL | 0 refills | Status: DC | PRN

## 2018-10-18 MED ORDER — OXYCODONE HCL 5 MG/5ML PO SOLN: 5.0000 mg | Freq: Once | ORAL | Status: DC | PRN

## 2018-10-18 MED ORDER — ACETAMINOPHEN 160 MG/5ML PO SOLN: 1000.0000 mg | Freq: Once | ORAL | Status: DC | PRN

## 2018-10-18 MED ORDER — MIDAZOLAM HCL 2 MG/2ML IJ SOLN
INTRAMUSCULAR | Status: AC
  Filled 2018-10-18: qty 2

## 2018-10-18 MED ORDER — BUPIVACAINE HCL (PF) 0.25 % IJ SOLN
INTRAMUSCULAR | Status: DC | PRN
  Administered 2018-10-18: 10 mL

## 2018-10-18 MED ORDER — CEFAZOLIN SODIUM-DEXTROSE 2-4 GM/100ML-% IV SOLN
INTRAVENOUS | Status: AC
  Filled 2018-10-18: qty 100

## 2018-10-18 MED ORDER — CHLORHEXIDINE GLUCONATE 4 % EX LIQD: 60.0000 mL | Freq: Once | CUTANEOUS | Status: DC

## 2018-10-18 MED ORDER — LACTATED RINGERS IV SOLN
INTRAVENOUS | Status: DC
  Administered 2018-10-18: 09:00:00 via INTRAVENOUS

## 2018-10-18 MED ORDER — ACETAMINOPHEN 10 MG/ML IV SOLN: 1000.0000 mg | Freq: Once | INTRAVENOUS | Status: DC | PRN

## 2018-10-18 MED ORDER — FENTANYL CITRATE (PF) 100 MCG/2ML IJ SOLN
50.0000 ug | INTRAMUSCULAR | Status: DC | PRN
  Administered 2018-10-18: 50 ug via INTRAVENOUS

## 2018-10-18 MED ORDER — PROPOFOL 10 MG/ML IV BOLUS
INTRAVENOUS | Status: AC
  Filled 2018-10-18: qty 20

## 2018-10-18 MED ORDER — CEFAZOLIN SODIUM-DEXTROSE 2-4 GM/100ML-% IV SOLN: 2.0000 g | INTRAVENOUS | Status: DC

## 2018-10-18 MED ORDER — ONDANSETRON HCL 4 MG/2ML IJ SOLN
INTRAMUSCULAR | Status: DC | PRN
  Administered 2018-10-18: 4 mg via INTRAVENOUS

## 2018-10-18 SURGICAL SUPPLY — 41 items
APL PRP STRL LF DISP 70% ISPRP (MISCELLANEOUS) ×2
BLADE SURG 15 STRL LF DISP TIS (BLADE) ×2 IMPLANT
BLADE SURG 15 STRL SS (BLADE) ×4
BNDG CMPR 9X4 STRL LF SNTH (GAUZE/BANDAGES/DRESSINGS)
BNDG COHESIVE 2X5 TAN STRL LF (GAUZE/BANDAGES/DRESSINGS) ×2 IMPLANT
BNDG COHESIVE 3X5 TAN STRL LF (GAUZE/BANDAGES/DRESSINGS) ×6 IMPLANT
BNDG ESMARK 4X9 LF (GAUZE/BANDAGES/DRESSINGS) IMPLANT
BNDG GAUZE ELAST 4 BULKY (GAUZE/BANDAGES/DRESSINGS) ×4 IMPLANT
CHLORAPREP W/TINT 26 (MISCELLANEOUS) ×4 IMPLANT
CORD BIPOLAR FORCEPS 12FT (ELECTRODE) ×4 IMPLANT
COVER BACK TABLE 60X90IN (DRAPES) ×4 IMPLANT
COVER MAYO STAND STRL (DRAPES) ×4 IMPLANT
COVER WAND RF STERILE (DRAPES) IMPLANT
CUFF TOURNIQUET SINGLE 18IN (TOURNIQUET CUFF) ×4 IMPLANT
DECANTER SPIKE VIAL GLASS SM (MISCELLANEOUS) IMPLANT
DRAPE EXTREMITY T 121X128X90 (DISPOSABLE) ×4 IMPLANT
DRAPE SURG 17X23 STRL (DRAPES) ×4 IMPLANT
DRSG PAD ABDOMINAL 8X10 ST (GAUZE/BANDAGES/DRESSINGS) ×4 IMPLANT
GAUZE SPONGE 4X4 12PLY STRL (GAUZE/BANDAGES/DRESSINGS) ×4 IMPLANT
GAUZE XEROFORM 1X8 LF (GAUZE/BANDAGES/DRESSINGS) ×4 IMPLANT
GLOVE BIO SURGEON STRL SZ 6.5 (GLOVE) ×3 IMPLANT
GLOVE BIO SURGEONS STRL SZ 6.5 (GLOVE) ×3
GLOVE BIOGEL PI IND STRL 7.0 (GLOVE) IMPLANT
GLOVE BIOGEL PI IND STRL 8.5 (GLOVE) ×2 IMPLANT
GLOVE BIOGEL PI INDICATOR 7.0 (GLOVE) ×4
GLOVE BIOGEL PI INDICATOR 8.5 (GLOVE) ×2
GLOVE SURG ORTHO 8.0 STRL STRW (GLOVE) ×4 IMPLANT
GOWN STRL REUS W/ TWL LRG LVL3 (GOWN DISPOSABLE) ×2 IMPLANT
GOWN STRL REUS W/TWL LRG LVL3 (GOWN DISPOSABLE) ×8
GOWN STRL REUS W/TWL XL LVL3 (GOWN DISPOSABLE) ×4 IMPLANT
NDL PRECISIONGLIDE 27X1.5 (NEEDLE) ×2 IMPLANT
NEEDLE PRECISIONGLIDE 27X1.5 (NEEDLE) ×4 IMPLANT
NS IRRIG 1000ML POUR BTL (IV SOLUTION) ×4 IMPLANT
PACK BASIN DAY SURGERY FS (CUSTOM PROCEDURE TRAY) ×4 IMPLANT
STOCKINETTE 4X48 STRL (DRAPES) ×4 IMPLANT
SUT ETHILON 4 0 PS 2 18 (SUTURE) ×4 IMPLANT
SUT VICRYL 4-0 PS2 18IN ABS (SUTURE) IMPLANT
SYR BULB 3OZ (MISCELLANEOUS) ×4 IMPLANT
SYR CONTROL 10ML LL (SYRINGE) ×4 IMPLANT
TOWEL GREEN STERILE FF (TOWEL DISPOSABLE) ×8 IMPLANT
UNDERPAD 30X30 (UNDERPADS AND DIAPERS) ×4 IMPLANT

## 2018-10-18 NOTE — Transfer of Care (Signed)
Immediate Anesthesia Transfer of Care Note  Patient: Jordan Wade  Procedure(s) Performed: CARPAL TUNNEL RELEASE (Right Wrist) RELEASE TRIGGER FINGER/A-1 PULLEY (Right Thumb)  Patient Location: PACU  Anesthesia Type:Bier block  Level of Consciousness: awake, alert , oriented and patient cooperative  Airway & Oxygen Therapy: Patient Spontanous Breathing and Patient connected to face mask oxygen  Post-op Assessment: Report given to RN and Post -op Vital signs reviewed and stable  Post vital signs: Reviewed and stable  Last Vitals:  Vitals Value Taken Time  BP    Temp    Pulse    Resp    SpO2      Last Pain:  Vitals:   10/18/18 0909  TempSrc: Oral  PainSc: 3          Complications: No apparent anesthesia complications

## 2018-10-18 NOTE — Anesthesia Preprocedure Evaluation (Signed)
Anesthesia Evaluation  Patient identified by MRN, date of birth, ID band Patient awake    Reviewed: Allergy & Precautions, NPO status , Patient's Chart, lab work & pertinent test results  History of Anesthesia Complications (+) PONV and history of anesthetic complications  Airway Mallampati: II  TM Distance: >3 FB Neck ROM: Full    Dental  (+) Teeth Intact   Pulmonary neg pulmonary ROS,    breath sounds clear to auscultation       Cardiovascular negative cardio ROS   Rhythm:Regular     Neuro/Psych  Neuromuscular disease negative psych ROS   GI/Hepatic negative GI ROS, Neg liver ROS,   Endo/Other  negative endocrine ROS  Renal/GU negative Renal ROS     Musculoskeletal  (+) Arthritis ,   Abdominal   Peds  Hematology negative hematology ROS (+)   Anesthesia Other Findings   Reproductive/Obstetrics                             Anesthesia Physical Anesthesia Plan  ASA: I  Anesthesia Plan: MAC and Bier Block and Bier Block-LIDOCAINE ONLY   Post-op Pain Management:    Induction:   PONV Risk Score and Plan: 3 and Treatment may vary due to age or medical condition and Propofol infusion  Airway Management Planned: Nasal Cannula  Additional Equipment: None  Intra-op Plan:   Post-operative Plan:   Informed Consent: I have reviewed the patients History and Physical, chart, labs and discussed the procedure including the risks, benefits and alternatives for the proposed anesthesia with the patient or authorized representative who has indicated his/her understanding and acceptance.     Dental advisory given  Plan Discussed with: CRNA and Surgeon  Anesthesia Plan Comments:         Anesthesia Quick Evaluation

## 2018-10-18 NOTE — Brief Op Note (Signed)
10/18/2018  11:15 AM  PATIENT:  Jordan Wade  63 y.o. female  PRE-OPERATIVE DIAGNOSIS:  RIGHT CARPAL TUNNEL SYNDROME TRIGGER FINGER RIGHT THUMB  POST-OPERATIVE DIAGNOSIS: Carpal tunnel syndrome trigger finger right hand right thumb*  PROCEDURE:  Procedure(s) with comments: CARPAL TUNNEL RELEASE (Right) - FAB RELEASE TRIGGER FINGER/A-1 PULLEY (Right) - FAB  SURGEON:  Surgeon(s) and Role:    * Daryll Brod, MD - Primary  PHYSICIAN ASSISTANT:   ASSISTANTS: none   ANESTHESIA:   local, regional and IV sedation  EBL:  1 mL   BLOOD ADMINISTERED:none  DRAINS: none   LOCAL MEDICATIONS USED:  BUPIVICAINE   SPECIMEN:  No Specimen  DISPOSITION OF SPECIMEN:  N/A  COUNTS:  YES  TOURNIQUET:   Total Tourniquet Time Documented: Forearm (Right) - 29 minutes Total: Forearm (Right) - 29 minutes   DICTATION: .Viviann Spare Dictation  PLAN OF CARE: Discharge to home after PACU  PATIENT DISPOSITION:  PACU - hemodynamically stable.

## 2018-10-18 NOTE — H&P (Signed)
Jordan Wade is an 63 y.o. female.   Chief Complaint:numbness right hand and catching thumbHPI:Jordan Wade is a 63 year old right-hand-dominant former patient who has not been seen in at least 6 years. She comes in with a complaint of pain in both hands right greater than left popping of her right thumb pain at the basilar joints with numbness and tingling of all of her fingers constantly on her right side to lesser extent left side. She has no history of injury to the hand or to the neck. She has been taking glucosamine chondroitin sulfate. She complains of an aching type pain with a VAS score 3/10 decreased grip. She is not awakened at night. She has no history of injury to the hand or to the neck. She is not taking anything for this. She has a history of arthritis no history of diabetes thyroid problems or gout. Family history is positive arthritis negative for the remainder.She had an injection to the thumb which she states has not helped and she feels it hurts more. Does complain of numbness and tingling. She has had nerve conductions done revealing bilateral carpal tunnel syndrome. Is complaining of a relatively constant numbness and tingling.      Past Medical History:  Diagnosis Date  . Arthritis    hands  . Carpal tunnel syndrome of right wrist   . PONV (postoperative nausea and vomiting)   . Trigger thumb of right hand     Past Surgical History:  Procedure Laterality Date  . BILATERAL ANTERIOR TOTAL HIP ARTHROPLASTY Bilateral 11/04/2012   Procedure: BILATERAL ANTERIOR TOTAL HIP ARTHROPLASTY;  Surgeon: Mcarthur Rossetti, MD;  Location: [facility];  Service: Orthopedics;  Laterality: Bilateral;  . CESAREAN SECTION     x 2  . JOINT REPLACEMENT      Family History  Problem Relation Age of Onset  . Hyperlipidemia Mother   . Stroke Mother   . Colon cancer Mother   . Hypertension Father   . Diabetes Maternal Grandfather   . Colon cancer Maternal Grandfather   . Heart attack  Maternal Grandfather   . Heart disease Maternal Grandfather   . Colon cancer Cousin        and paternal greatgrandfather  . Breast cancer Paternal Aunt    Social History:  reports that she has never smoked. She has never used smokeless tobacco. She reports that she does not drink alcohol or use drugs.  Allergies: No Known Allergies  No medications prior to admission.    No results found for this or any previous visit (from the past 48 hour(s)).  No results found.   Pertinent items are noted in HPI.  Height 5\' 2"  (1.575 m), weight 62.6 kg.  General appearance: alert, cooperative and appears stated age Head: Normocephalic, without obvious abnormality Neck: no JVD Resp: clear to auscultation bilaterally Cardio: regular rate and rhythm, S1, S2 normal, no murmur, click, rub or gallop GI: soft, non-tender; bowel sounds normal; no masses,  no organomegaly Extremities: numbness right hand and catching thumb Pulses: 2+ and symmetric Skin: Skin color, texture, turgor normal. No rashes or lesions Neurologic: Grossly normal Incision/Wound: na  Assessment/Plan Assessment:  1. Trigger finger of right thumb  2. Bilateral carpal tunnel syndrome    Plan: Have offered a second injection to the A1 pulley she is declined. We have discussed possibility of surgical release of the A1 pulley and release of the carpal canal at the same time she would like to have that done. Pre-peri-and postoperative  course are discussed along with risk complications. She is aware there is no guarantee to the surgery the possibility of infection recurrence injury to arteries nerves tendons complete relief symptoms and dystrophy. She is scheduled for carpal tunnel release right hand trigger finger release right thumb as an outpatient under regional anesthesia.   Daryll Brod 10/18/2018, 8:23 AM

## 2018-10-18 NOTE — Discharge Instructions (Signed)

## 2018-10-18 NOTE — Op Note (Signed)
NAME: Jordan Wade MEDICAL RECORD NO: [id number] DATE OF BIRTH: 07/01/1956 FACILITY: [facility] LOCATION: [facility] PHYSICIAN: Wynonia Sours, MD   OPERATIVE REPORT   DATE OF PROCEDURE: 10/18/18    PREOPERATIVE DIAGNOSIS:   Stenosing tenosynovitis right thumb carpal tunnel syndrome right hand   POSTOPERATIVE DIAGNOSIS:   Same   PROCEDURE:   Decompression median nerve right wrist with release A1 pulley right thumb   SURGEON: Daryll Brod, M.D.   ASSISTANT: none   ANESTHESIA:  Bier block with sedation and Local   INTRAVENOUS FLUIDS:  Per anesthesia flow sheet.   ESTIMATED BLOOD LOSS:  Minimal.   COMPLICATIONS:  None.   SPECIMENS:  none   TOURNIQUET TIME:    Total Tourniquet Time Documented: Forearm (Right) - 29 minutes Total: Forearm (Right) - 29 minutes    DISPOSITION:  Stable to PACU.   INDICATIONS: Patient is a 63 year old female with a history of numbness and tingling of her right hand catching of her right thumb which has not responded to conservative treatment.  Nerve conductions are positive for carpal tunnel syndrome.  She is elected to undergo surgical decompression of the median nerve with release of the A1 pulley right thumb.  Pre-peri-and postoperative course been discussed along with risks and complications.  She is aware that there is no guarantee to the surgery the possibility of infection recurrence injury to arteries nerves tendons complete relief symptoms dystrophy.  Preoperative area the patient is seen the extremity marked by both patient and surgeon antibiotic given  OPERATIVE COURSE: Patient is brought to the operating room where form based IV regional anesthetic was carried out without difficulty under the direction the anesthesia department.  She was prepped using ChloraPrep and in supine position with the right arm free.  A three-minute dry time was allowed timeout taken to confirm patient procedure.  The thumb was attended to first.   Transverse incision was made over the A1 pulley of the thumb carried down through subcutaneous tissue.  The neurovascular bundles radially and ulnarly were identified protected with retractors.  The A1 pulley was isolated.  This was released on its radial aspect.  Oblique pulley was left intact.  Tenosynovial tissue proximally was separated with blunt dissection.  The thumb placed through full range of motion no further triggering was noted an area compression of the tendon was apparent.  The wound was irrigated and closed with interrupted 4-0 nylon sutures.  A separate incision was then made over the mid palm longitudinally in nature.  This was carried down through subcutaneous tissue.  Bleeders again the left electrocauterized with bipolar.  The palmar shoe was split revealing the superficial palmar arch.  The flexor tendon to the ring little finger was identified.  Retractors were placed retracting median nerve and flexor tendons radially ulnar nerve ulnarly.  The flexor retinaculum was then released on its ulnar border.  A right angle and saw retractor was placed between skin and forearm fascia the fascia was released for approximately 2 to 3 cm proximal to the wrist crease under direct vision after dissecting deep structures off from it.  The nerve was explored.  An area compression to the nerve was apparent.  No further lesions were identified.  The motor branch entered in the muscle distally.  The wound was copiously irrigated with saline.  The skin was closed interrupted 4-0 nylon sutures.  Local infiltration quarter percent bupivacaine without epinephrine was given approximately 8 cc was used to the 2 wounds.  Sterile compressive dressing with the fingers 3 was applied.  Deflation the tourniquet all fingers immediately pink.  She was taken to the recovery room for observation in satisfactory condition.  She will be discharged home to return to [facility] in 1 week on Tylenol ibuprofen with  Ultram as a backup.   Daryll Brod, MD Electronically signed, 10/18/18

## 2018-10-19 ENCOUNTER — Encounter (HOSPITAL_BASED_OUTPATIENT_CLINIC_OR_DEPARTMENT_OTHER): Payer: Self-pay | Admitting: Orthopedic Surgery

## 2018-10-20 ENCOUNTER — Encounter (HOSPITAL_BASED_OUTPATIENT_CLINIC_OR_DEPARTMENT_OTHER): Payer: Self-pay | Admitting: Orthopedic Surgery

## 2018-10-20 NOTE — Anesthesia Postprocedure Evaluation (Signed)
Anesthesia Post Note  Patient: Jordan Wade  Procedure(s) Performed: CARPAL TUNNEL RELEASE (Right Wrist) RELEASE TRIGGER FINGER/A-1 PULLEY (Right Thumb)     Patient location during evaluation: PACU Anesthesia Type: MAC and Bier Block Level of consciousness: awake and alert Pain management: pain level controlled Vital Signs Assessment: post-procedure vital signs reviewed and stable Respiratory status: spontaneous breathing, nonlabored ventilation, respiratory function stable and patient connected to nasal cannula oxygen Cardiovascular status: stable and blood pressure returned to baseline Postop Assessment: no apparent nausea or vomiting Anesthetic complications: no    Last Vitals:  Vitals:   10/18/18 1130 10/18/18 1205  BP: 123/70 124/74  Pulse: (!) 52 (!) 59  Resp: 20 16  Temp:  36.4 C  SpO2: 98% 100%    Last Pain:  Vitals:   10/19/18 0831  TempSrc:   PainSc: 4                  [name]

## 2018-11-08 ENCOUNTER — Encounter: Payer: BC Managed Care – PPO | Admitting: Family Medicine

## 2019-02-27 ENCOUNTER — Encounter: Payer: BC Managed Care – PPO | Admitting: Family Medicine

## 2019-03-07 ENCOUNTER — Other Ambulatory Visit: Payer: Self-pay | Admitting: Family Medicine

## 2019-03-07 DIAGNOSIS — Z1231 Encounter for screening mammogram for malignant neoplasm of breast: Secondary | ICD-10-CM

## 2019-03-22 ENCOUNTER — Ambulatory Visit
Admission: RE | Admit: 2019-03-22 | Discharge: 2019-03-22 | Disposition: A | Discharge status: Home / Self Care | Payer: BC Managed Care – PPO | Source: Ambulatory Visit | Attending: Family Medicine | Admitting: Family Medicine

## 2019-03-22 ENCOUNTER — Other Ambulatory Visit: Payer: Self-pay

## 2019-03-22 DIAGNOSIS — Z1231 Encounter for screening mammogram for malignant neoplasm of breast: Secondary | ICD-10-CM

## 2019-04-12 ENCOUNTER — Other Ambulatory Visit (HOSPITAL_COMMUNITY)
Admission: RE | Admit: 2019-04-12 | Discharge: 2019-04-12 | Disposition: A | Discharge status: Home / Self Care | Payer: Medicare Other | Source: Ambulatory Visit | Attending: Family Medicine | Admitting: Family Medicine

## 2019-04-12 ENCOUNTER — Ambulatory Visit (INDEPENDENT_AMBULATORY_CARE_PROVIDER_SITE_OTHER): Payer: Medicare Other | Admitting: Family Medicine

## 2019-04-12 ENCOUNTER — Other Ambulatory Visit: Payer: Self-pay

## 2019-04-12 VITALS — BP 132/70 | HR 72 | Temp 99.0°F | Resp 16 | Ht 61.5 in | Wt 139.2 lb

## 2019-04-12 DIAGNOSIS — R7302 Impaired glucose tolerance (oral): Secondary | ICD-10-CM

## 2019-04-12 DIAGNOSIS — Z8739 Personal history of other diseases of the musculoskeletal system and connective tissue: Secondary | ICD-10-CM

## 2019-04-12 DIAGNOSIS — Z1211 Encounter for screening for malignant neoplasm of colon: Secondary | ICD-10-CM

## 2019-04-12 DIAGNOSIS — Z124 Encounter for screening for malignant neoplasm of cervix: Secondary | ICD-10-CM

## 2019-04-12 DIAGNOSIS — Z23 Encounter for immunization: Secondary | ICD-10-CM | POA: Diagnosis not present

## 2019-04-12 DIAGNOSIS — Z800 Family history of malignant neoplasm of digestive organs: Secondary | ICD-10-CM

## 2019-04-12 DIAGNOSIS — Z7189 Other specified counseling: Secondary | ICD-10-CM

## 2019-04-12 DIAGNOSIS — Z0000 Encounter for general adult medical examination without abnormal findings: Secondary | ICD-10-CM | POA: Diagnosis not present

## 2019-04-12 DIAGNOSIS — E7800 Pure hypercholesterolemia, unspecified: Secondary | ICD-10-CM

## 2019-04-12 LAB — POC HEMOCCULT BLD/STL (OFFICE/1-CARD/DIAGNOSTIC): Fecal Occult Blood, POC: NEGATIVE

## 2019-04-12 NOTE — Progress Notes (Signed)
Impression and Recommendations:    1. Encounter for wellness examination   2. History of osteopenia   3. Screening for colon cancer   4. Screening for cervical cancer   5. Counseling on health promotion and disease prevention   6. Glucose intolerance (impaired glucose tolerance) A1c 5.9 January '18   7. Elevated LDL cholesterol level   8. Family history of colon cancer-  mom age 65      1) Anticipatory Guidance: Discussed importance of wearing a seatbelt while driving, not texting while driving; sunscreen when outside along with yearly skin surveillance; eating a well balanced and modest diet; physical activity at least 25 minutes per day or 150 min/ week of moderate to intense activity.  - Prudent self-breast screening habits reviewed during exam.  - Encouraged patient to seek care with foot specialist for related concerns.  2) Immunizations / Screenings / Labs:  All immunizations and screenings that patient agrees to, are up-to-date per recommendations or will be updated today.  Patient understands the needs for q 32modental and yearly vision screens which pt will schedule independently. Obtain CBC, CMP, HgA1c, Lipid panel, TSH and vit D when fasting if not already done recently.   - Last pap smear obtained in 2017. - Need for pap smear today.  Obtained.  - Obtained her last mammogram on August 13th, 2020. - Obtained last colonoscopy on 05/27/2018 w/ 5-year follow-up.  3) Weight:   Discussed goal of losing even 5-10% of current body weight which would improve overall feelings of well being and improve objective health data significantly.   Improve nutrient density of diet through increasing intake of fruits and vegetables and decreasing saturated/trans fats, white flour products and refined sugar products.   - Health counseling performed.  All questions were answered.  4) Recommendations - Return for follow-up in 6 months. - Discussed that if patient meets criteria for  medication, meds will be recommended. - Patient knows she may return to discuss labs more extensively once resulted. - Otherwise, patient knows to get in touch for health concerns PRN.   Orders Placed This Encounter  Procedures  . Varicella-zoster vaccine IM (Shingrix)  . CBC w/Diff  . Comp Met (CMET)  . HgB A1c  . Lipid Panel With LDL/HDL Ratio  . TSH  . T4, free  . T3  . Vitamin D (25 hydroxy)  . POC Hemoccult Bld/Stl (1-Cd Office Dx)    Gross side effects, risk and benefits, and alternatives of medications discussed with patient.  Patient is aware that all medications have potential side effects and we are unable to predict every side effect or drug-drug interaction that may occur.  Expresses verbal understanding and consents to current therapy plan and treatment regimen.  F-up preventative CPE in 1 year. F/up sooner for chronic care management as discussed and/or prn.  Please see orders placed and AVS handed out to patient at the end of our visit for further patient instructions/ counseling done pertaining to today's office visit.   This document serves as a record of services personally performed by DMellody Dance DO. It was created on her behalf by KToni Wade a trained medical scribe. The creation of this record is based on the scribe's personal observations and the provider's statements to them.   I have reviewed the above medical documentation for accuracy and completeness and I concur.  DMellody Dance DO 04/12/2019 8:56 AM       Subjective:    Chief Complaint  Patient  presents with  . Annual Exam    issues with her rt foot no other concerns   CC:   HPI: Jordan Wade is a 63 y.o. female who presents to [place] at [facility] today a yearly health maintenance exam.  Health Maintenance Summary Reviewed and updated, unless pt declines services.  Colonoscopy:  Last colonoscopy was obtained last summer, 2019, with on polyp removed  and 5-year follow-up.  Her mother has had colon cancer twice, in her 36's the first time, and 72's the second time.  Per pt, she has been getting colonoscopy since she was around 51.  Also had another second-degree relative with colon cancer. Tobacco History Reviewed:   Y; never smoker.  Alcohol:    No concerns, no excessive use Exercise Habits:   Not meeting AHA guidelines STD concerns:   none Drug Use:   None Birth control method:   Post-menopausal. Menses regular:  Post-menopausal. Lumps or breast concerns:      no Breast Cancer Family History:  No first degree relatives; paternal aunt with breast cancer. Notes she has gotten lazy about performing self-breast exams. Bone/ DEXA scan:   Last obtained in 2018.  Says she's being careful and taking care of herself during [place].   Notes her only concern today is with regards to tightness in a tendon in her leg.  Patient notes that this is not interfering with her quality of life, and understands need to return in near future for evaluation of chronic concerns.  Female Health Denies history of abnormal pap smear. Obtained her last mammogram on August 13th, 2020.  Last pap smear 2017, negative for HPV.  Dermatological Health Patient has a history of melanoma removed from left breast. Follows up with [facility].  She goes every six months; notes she was going much more frequently after the melanoma at first, "started at every three months and now down to six."  Notes she has a lot of skin damage from "before the days of sunscreen."  Eye Health Obtains eye exams regularly.  Denies history of cataracts or glaucoma.  Dental Health Goes to the dentist twice a year.     Immunization History  Administered Date(s) Administered  . Influenza-Unspecified 04/10/2016, 04/10/2017, 05/10/2018  . Tdap 08/14/2015  . Zoster 08/11/2015  . Zoster Recombinat (Shingrix) 04/12/2019    Health Maintenance  Topic Date Due  . PAP SMEAR-Modifier   08/13/2018  . DEXA SCAN  09/07/2018  . INFLUENZA VACCINE  03/11/2019  . HIV Screening  02/18/2029 (Originally 05/08/1971)  . MAMMOGRAM  03/21/2021  . COLONOSCOPY  05/28/2023  . TETANUS/TDAP  08/13/2025  . Hepatitis C Screening  Completed     Wt Readings from Last 3 Encounters:  04/12/19 139 lb 3.2 oz (63.1 kg)  10/18/18 138 lb 3.7 oz (62.7 kg)  05/27/18 136 lb (61.7 kg)   BP Readings from Last 3 Encounters:  04/12/19 132/70  10/18/18 124/74  05/27/18 129/70   Pulse Readings from Last 3 Encounters:  04/12/19 72  10/18/18 (!) 59  05/27/18 (!) 54     Past Medical History:  Diagnosis Date  . Arthritis    hands  . Carpal tunnel syndrome of right wrist   . PONV (postoperative nausea and vomiting)   . Trigger thumb of right hand       Past Surgical History:  Procedure Laterality Date  . BILATERAL ANTERIOR TOTAL HIP ARTHROPLASTY Bilateral 11/04/2012   Procedure: BILATERAL ANTERIOR TOTAL HIP ARTHROPLASTY;  Surgeon:  Mcarthur Rossetti, MD;  Location: [facility];  Service: Orthopedics;  Laterality: Bilateral;  . CARPAL TUNNEL RELEASE Right 10/18/2018   Procedure: CARPAL TUNNEL RELEASE;  Surgeon: Daryll Brod, MD;  Location: [facility];  Service: Orthopedics;  Laterality: Right;  FAB  . CESAREAN SECTION     x 2  . JOINT REPLACEMENT    . TRIGGER FINGER RELEASE Right 10/18/2018   Procedure: RELEASE TRIGGER FINGER/A-1 PULLEY;  Surgeon: Daryll Brod, MD;  Location: [facility];  Service: Orthopedics;  Laterality: Right;  FAB      Family History  Problem Relation Age of Onset  . Hyperlipidemia Mother   . Stroke Mother   . Colon cancer Mother   . Hypertension Father   . Diabetes Maternal Grandfather   . Colon cancer Maternal Grandfather   . Heart attack Maternal Grandfather   . Heart disease Maternal Grandfather   . Colon cancer Cousin        and paternal greatgrandfather  . Breast cancer Paternal Aunt        apprx. 42      Social History    Substance and Sexual Activity  Drug Use No  ,   Social History   Substance and Sexual Activity  Alcohol Use No  ,   Social History   Tobacco Use  Smoking Status Never Smoker  Smokeless Tobacco Never Used  ,   Social History   Substance and Sexual Activity  Sexual Activity Not on file    Current Outpatient Medications on File Prior to Visit  Medication Sig Dispense Refill  . aspirin 81 MG chewable tablet Chew 81 mg by mouth daily.    . Calcium Carb-Cholecalciferol [phone] MG-UNIT CAPS Take 1 capsule by mouth 2 (two) times daily.      No current facility-administered medications on file prior to visit.     Allergies: Patient has no known allergies.  Review of Systems: General:   Denies fever, chills, unexplained weight loss.  Optho/Auditory:   Denies visual changes, blurred vision/LOV Respiratory:   Denies SOB, DOE more than baseline levels.  Cardiovascular:   Denies chest pain, palpitations, new onset peripheral edema  Gastrointestinal:   Denies nausea, vomiting, diarrhea.  Genitourinary: Denies dysuria, freq/ urgency, flank pain or discharge from genitals.  Endocrine:     Denies hot or cold intolerance, polyuria, polydipsia. Musculoskeletal:   Denies unexplained myalgias, joint swelling, unexplained arthralgias, gait problems.  Skin:  Denies rash, suspicious lesions Neurological:     Denies dizziness, unexplained weakness, numbness  Psychiatric/Behavioral:   Denies mood changes, suicidal or homicidal ideations, hallucinations    Objective:    Blood pressure 132/70, pulse 72, temperature 99 F (37.2 C), resp. rate 16, height 5' 1.5" (1.562 m), weight 139 lb 3.2 oz (63.1 kg), SpO2 99 %. Body mass index is 25.88 kg/m. General Appearance:    Alert, cooperative, no distress, appears stated age  Head:    Normocephalic, without obvious abnormality, atraumatic  Eyes:    PERRL, conjunctiva/corneas clear, EOM's intact, fundi    benign, both eyes  Ears:    Normal  TM's and external ear canals, both ears  Nose:   Nares normal, septum midline, mucosa normal, no drainage    or sinus tenderness  Throat:   Lips w/o lesion, mucosa moist, and tongue normal; teeth and   gums normal  Neck:   Supple, symmetrical, trachea midline, no adenopathy;    thyroid:  no enlargement/tenderness/nodules; no carotid   bruit or  JVD  Back:     Symmetric, no curvature, ROM normal, no CVA tenderness  Lungs:     Clear to auscultation bilaterally, respirations unlabored, no       Wh/ R/ R  Chest Wall:    No tenderness or gross deformity; normal excursion   Heart:    Regular rate and rhythm, S1 and S2 normal, no murmur, rub   or gallop  Breast Exam:    Scar on left inner upper breast quadrant from removal of skin lesion, melanoma.  Scar is approximately 2.5 inches in length, well-healed.  No tenderness, masses, or nipple abnormality b/l; no d/c  Abdomen:     Vertical scar on the abdomen from C-section.  Small, easily reducible periumbilical hernia present.  It is non-tender.  Abdomen is otherwise soft, non-tender, bowel sounds active all four quadrants, NO   G/R/R, no masses, no organomegaly  Genitalia:    Ext genitalia: without lesion, no rash or discharge, No         tenderness;  Cervix: WNL's w/o discharge or lesion;        Adnexa:  No tenderness or palpable masses   Rectal:    Normal tone, no masses or tenderness;   guaiac negative stool  Extremities:   Extremities normal, atraumatic, no cyanosis or gross edema  Pulses:   2+ and symmetric all extremities  Skin:   Warm, dry, Skin color, texture, turgor normal, no obvious rashes or lesions Psych: No HI/SI, judgement and insight good, Euthymic mood. Full Affect.  Neurologic:   CNII-XII intact, normal strength, sensation and reflexes    Throughout

## 2019-04-12 NOTE — Patient Instructions (Signed)
Preventive Care for Adults, Female  A healthy lifestyle and preventive care can promote health and wellness. Preventive health guidelines for women include the following key practices.   A routine yearly physical is a good way to check with your health care provider about your health and preventive screening. It is a chance to share any concerns and updates on your health and to receive a thorough exam.   Visit your dentist for a routine exam and preventive care every 6 months. Brush your teeth twice a day and floss once a day. Good oral hygiene prevents tooth decay and gum disease.   The frequency of eye exams is based on your age, health, family medical history, use of contact lenses, and other factors. Follow your health care provider's recommendations for frequency of eye exams.   Eat a healthy diet. Foods like vegetables, fruits, whole grains, low-fat dairy products, and lean protein foods contain the nutrients you need without too many calories. Decrease your intake of foods high in solid fats, added sugars, and salt. Eat the right amount of calories for you.Get information about a proper diet from your health care provider, if necessary.   Regular physical exercise is one of the most important things you can do for your health. Most adults should get at least 150 minutes of moderate-intensity exercise (any activity that increases your heart rate and causes you to sweat) each week. In addition, most adults need muscle-strengthening exercises on 2 or more days a week.   Maintain a healthy weight. The body mass index (BMI) is a screening tool to identify possible weight problems. It provides an estimate of body fat based on height and weight. Your health care provider can find your BMI, and can help you achieve or maintain a healthy weight.For adults 20 years and older:   - A BMI below 18.5 is considered underweight.   - A BMI of 18.5 to 24.9 is normal.   - A BMI of 25 to 29.9 is  considered overweight.   - A BMI of 30 and above is considered obese.   Maintain normal blood lipids and cholesterol levels by exercising and minimizing your intake of trans and saturated fats.  Eat a balanced diet with plenty of fruit and vegetables. Blood tests for lipids and cholesterol should begin at age 65 and be repeated every 5 years minimum.  If your lipid or cholesterol levels are high, you are over 40, or you are at high risk for heart disease, you may need your cholesterol levels checked more frequently.Ongoing high lipid and cholesterol levels should be treated with medicines if diet and exercise are not working.   If you smoke, find out from your health care provider how to quit. If you do not use tobacco, do not start.   Lung cancer screening is recommended for adults aged 10-80 years who are at high risk for developing lung cancer because of a history of smoking. A yearly low-dose CT scan of the lungs is recommended for people who have at least a 30-pack-year history of smoking and are a current smoker or have quit within the past 15 years. A pack year of smoking is smoking an average of 1 pack of cigarettes a day for 1 year (for example: 1 pack a day for 30 years or 2 packs a day for 15 years). Yearly screening should continue until the smoker has stopped smoking for at least 15 years. Yearly screening should be stopped for people who develop a  health problem that would prevent them from having lung cancer treatment.   If you are pregnant, do not drink alcohol. If you are breastfeeding, be very cautious about drinking alcohol. If you are not pregnant and choose to drink alcohol, do not have more than 1 drink per day. One drink is considered to be 12 ounces (355 mL) of beer, 5 ounces (148 mL) of wine, or 1.5 ounces (44 mL) of liquor.   Avoid use of street drugs. Do not share needles with anyone. Ask for help if you need support or instructions about stopping the use of  drugs.   High blood pressure causes heart disease and increases the risk of stroke. Your blood pressure should be checked at least yearly.  Ongoing high blood pressure should be treated with medicines if weight loss and exercise do not work.   If you are 25-14 years old, ask your health care provider if you should take aspirin to prevent strokes.   Diabetes screening involves taking a blood sample to check your fasting blood sugar level. This should be done once every 3 years, after age 48, if you are within normal weight and without risk factors for diabetes. Testing should be considered at a younger age or be carried out more frequently if you are overweight and have at least 1 risk factor for diabetes.   Breast cancer screening is essential preventive care for women. You should practice "breast self-awareness."  This means understanding the normal appearance and feel of your breasts and may include breast self-examination.  Any changes detected, no matter how small, should be reported to a health care provider.  Women in their 89s and 30s should have a clinical breast exam (CBE) by a health care provider as part of a regular health exam every 1 to 3 years.  After age 63, women should have a CBE every year.  Starting at age 81, women should consider having a mammogram (breast X-ray test) every year.  Women who have a family history of breast cancer should talk to their health care provider about genetic screening.  Women at a high risk of breast cancer should talk to their health care providers about having an MRI and a mammogram every year.   -Breast cancer gene (BRCA)-related cancer risk assessment is recommended for women who have family members with BRCA-related cancers. BRCA-related cancers include breast, ovarian, tubal, and peritoneal cancers. Having family members with these cancers may be associated with an increased risk for harmful changes (mutations) in the breast cancer genes BRCA1 and  BRCA2. Results of the assessment will determine the need for genetic counseling and BRCA1 and BRCA2 testing.   The Pap test is a screening test for cervical cancer. A Pap test can show cell changes on the cervix that might become cervical cancer if left untreated. A Pap test is a procedure in which cells are obtained and examined from the lower end of the uterus (cervix).   - Women should have a Pap test starting at age 13.   - Between ages 51 and 22, Pap tests should be repeated every 2 years.   - Beginning at age 70, you should have a Pap test every 3 years as long as the past 3 Pap tests have been normal.   - Some women have medical problems that increase the chance of getting cervical cancer. Talk to your health care provider about these problems. It is especially important to talk to your health care provider if a  new problem develops soon after your last Pap test. In these cases, your health care provider may recommend more frequent screening and Pap tests.   - The above recommendations are the same for women who have or have not gotten the vaccine for human papillomavirus (HPV).   - If you had a hysterectomy for a problem that was not cancer or a condition that could lead to cancer, then you no longer need Pap tests. Even if you no longer need a Pap test, a regular exam is a good idea to make sure no other problems are starting.   - If you are between ages 8 and 94 years, and you have had normal Pap tests going back 10 years, you no longer need Pap tests. Even if you no longer need a Pap test, a regular exam is a good idea to make sure no other problems are starting.   - If you have had past treatment for cervical cancer or a condition that could lead to cancer, you need Pap tests and screening for cancer for at least 20 years after your treatment.   - If Pap tests have been discontinued, risk factors (such as a new sexual partner) need to be reassessed to determine if screening should  be resumed.   - The HPV test is an additional test that may be used for cervical cancer screening. The HPV test looks for the virus that can cause the cell changes on the cervix. The cells collected during the Pap test can be tested for HPV. The HPV test could be used to screen women aged 9 years and older, and should be used in women of any age who have unclear Pap test results. After the age of 48, women should have HPV testing at the same frequency as a Pap test.   Colorectal cancer can be detected and often prevented. Most routine colorectal cancer screening begins at the age of 94 years and continues through age 56 years. However, your health care provider may recommend screening at an earlier age if you have risk factors for colon cancer. On a yearly basis, your health care provider may provide home test kits to check for hidden blood in the stool.  Use of a small camera at the end of a tube, to directly examine the colon (sigmoidoscopy or colonoscopy), can detect the earliest forms of colorectal cancer. Talk to your health care provider about this at age 47, when routine screening begins. Direct exam of the colon should be repeated every 5 -10 years through age 65 years, unless early forms of pre-cancerous polyps or small growths are found.   People who are at an increased risk for hepatitis B should be screened for this virus. You are considered at high risk for hepatitis B if:  -You were born in a country where hepatitis B occurs often. Talk with your health care provider about which countries are considered high risk.  - Your parents were born in a high-risk country and you have not received a shot to protect against hepatitis B (hepatitis B vaccine).  - You have HIV or AIDS.  - You use needles to inject street drugs.  - You live with, or have sex with, someone who has Hepatitis B.  - You get hemodialysis treatment.  - You take certain medicines for conditions like cancer, organ  transplantation, and autoimmune conditions.   Hepatitis C blood testing is recommended for all people born from [birth year] through 1965 and any individual  with known risks for hepatitis C.   Practice safe sex. Use condoms and avoid high-risk sexual practices to reduce the spread of sexually transmitted infections (STIs). STIs include gonorrhea, chlamydia, syphilis, trichomonas, herpes, HPV, and human immunodeficiency virus (HIV). Herpes, HIV, and HPV are viral illnesses that have no cure. They can result in disability, cancer, and death. Sexually active women aged 17 years and younger should be checked for chlamydia. Older women with new or multiple partners should also be tested for chlamydia. Testing for other STIs is recommended if you are sexually active and at increased risk.   Osteoporosis is a disease in which the bones lose minerals and strength with aging. This can result in serious bone fractures or breaks. The risk of osteoporosis can be identified using a bone density scan. Women ages 38 years and over and women at risk for fractures or osteoporosis should discuss screening with their health care providers. Ask your health care provider whether you should take a calcium supplement or vitamin D to There are also several preventive steps women can take to avoid osteoporosis and resulting fractures or to keep osteoporosis from worsening. -->Recommendations include:  Eat a balanced diet high in fruits, vegetables, calcium, and vitamins.  Get enough calcium. The recommended total intake of is 1,200 mg daily; for best absorption, if taking supplements, divide doses into 250-500 mg doses throughout the day. Of the two types of calcium, calcium carbonate is best absorbed when taken with food but calcium citrate can be taken on an empty stomach.  Get enough vitamin D. [employer] and the [place] recommend at least 1,000 IU per day for women age 28 and over who are at risk of vitamin D  deficiency. Vitamin D deficiency can be caused by inadequate sun exposure (for example, those who live in [place]).  Avoid alcohol and smoking. Heavy alcohol intake (more than 7 drinks per week) increases the risk of falls and hip fracture and women smokers tend to lose bone more rapidly and have lower bone mass than nonsmokers. Stopping smoking is one of the most important changes women can make to improve their health and decrease risk for disease.  Be physically active every day. Weight-bearing exercise (for example, fast walking, hiking, jogging, and weight training) may strengthen bones or slow the rate of bone loss that comes with aging. Balancing and muscle-strengthening exercises can reduce the risk of falling and fracture.  Consider therapeutic medications. Currently, several types of effective drugs are available. Healthcare providers can recommend the type most appropriate for each woman.  Eliminate environmental factors that may contribute to accidents. Falls cause nearly 90% of all osteoporotic fractures, so reducing this risk is an important bone-health strategy. Measures include ample lighting, removing obstructions to walking, using nonskid rugs on floors, and placing mats and/or grab bars in showers.  Be aware of medication side effects. Some common medicines make bones weaker. These include a type of steroid drug called glucocorticoids used for arthritis and asthma, some antiseizure drugs, certain sleeping pills, treatments for endometriosis, and some cancer drugs. An overactive thyroid gland or using too much thyroid hormone for an underactive thyroid can also be a problem. If you are taking these medicines, talk to your doctor about what you can do to help protect your bones.reduce the rate of osteoporosis.    Menopause can be associated with physical symptoms and risks. Hormone replacement therapy is available to decrease symptoms and risks. You should talk to your  health care provider  about whether hormone replacement therapy is right for you.   Use sunscreen. Apply sunscreen liberally and repeatedly throughout the day. You should seek shade when your shadow is shorter than you. Protect yourself by wearing long sleeves, pants, a wide-brimmed hat, and sunglasses year round, whenever you are outdoors.   Once a month, do a whole body skin exam, using a mirror to look at the skin on your back. Tell your health care provider of new moles, moles that have irregular borders, moles that are larger than a pencil eraser, or moles that have changed in shape or color.   -Stay current with required vaccines (immunizations).   Influenza vaccine. All adults should be immunized every year.  Tetanus, diphtheria, and acellular pertussis (Td, Tdap) vaccine. Pregnant women should receive 1 dose of Tdap vaccine during each pregnancy. The dose should be obtained regardless of the length of time since the last dose. Immunization is preferred during the 27th 36th week of gestation. An adult who has not previously received Tdap or who does not know her vaccine status should receive 1 dose of Tdap. This initial dose should be followed by tetanus and diphtheria toxoids (Td) booster doses every 10 years. Adults with an unknown or incomplete history of completing a 3-dose immunization series with Td-containing vaccines should begin or complete a primary immunization series including a Tdap dose. Adults should receive a Td booster every 10 years.  Varicella vaccine. An adult without evidence of immunity to varicella should receive 2 doses or a second dose if she has previously received 1 dose. Pregnant females who do not have evidence of immunity should receive the first dose after pregnancy. This first dose should be obtained before leaving the health care facility. The second dose should be obtained 4 8 weeks after the first dose.  Human papillomavirus (HPV) vaccine. Females aged 13 26  years who have not received the vaccine previously should obtain the 3-dose series. The vaccine is not recommended for use in pregnant females. However, pregnancy testing is not needed before receiving a dose. If a female is found to be pregnant after receiving a dose, no treatment is needed. In that case, the remaining doses should be delayed until after the pregnancy. Immunization is recommended for any person with an immunocompromised condition through the age of 26 years if she did not get any or all doses earlier. During the 3-dose series, the second dose should be obtained 4 8 weeks after the first dose. The third dose should be obtained 24 weeks after the first dose and 16 weeks after the second dose.  Zoster vaccine. One dose is recommended for adults aged 60 years or older unless certain conditions are present.  Measles, mumps, and rubella (MMR) vaccine. Adults born before 1957 generally are considered immune to measles and mumps. Adults born in 1957 or later should have 1 or more doses of MMR vaccine unless there is a contraindication to the vaccine or there is laboratory evidence of immunity to each of the three diseases. A routine second dose of MMR vaccine should be obtained at least 28 days after the first dose for students attending postsecondary schools, health care workers, or international travelers. People who received inactivated measles vaccine or an unknown type of measles vaccine during 1963 1967 should receive 2 doses of MMR vaccine. People who received inactivated mumps vaccine or an unknown type of mumps vaccine before 1979 and are at high risk for mumps infection should consider immunization with 2 doses of   MMR vaccine. For females of childbearing age, rubella immunity should be determined. If there is no evidence of immunity, females who are not pregnant should be vaccinated. If there is no evidence of immunity, females who are pregnant should delay immunization until after pregnancy.  Unvaccinated health care workers born before 1957 who lack laboratory evidence of measles, mumps, or rubella immunity or laboratory confirmation of disease should consider measles and mumps immunization with 2 doses of MMR vaccine or rubella immunization with 1 dose of MMR vaccine.  Pneumococcal 13-valent conjugate (PCV13) vaccine. When indicated, a person who is uncertain of her immunization history and has no record of immunization should receive the PCV13 vaccine. An adult aged 19 years or older who has certain medical conditions and has not been previously immunized should receive 1 dose of PCV13 vaccine. This PCV13 should be followed with a dose of pneumococcal polysaccharide (PPSV23) vaccine. The PPSV23 vaccine dose should be obtained at least 8 weeks after the dose of PCV13 vaccine. An adult aged 19 years or older who has certain medical conditions and previously received 1 or more doses of PPSV23 vaccine should receive 1 dose of PCV13. The PCV13 vaccine dose should be obtained 1 or more years after the last PPSV23 vaccine dose.  Pneumococcal polysaccharide (PPSV23) vaccine. When PCV13 is also indicated, PCV13 should be obtained first. All adults aged 65 years and older should be immunized. An adult younger than age 65 years who has certain medical conditions should be immunized. Any person who resides in a nursing home or long-term care facility should be immunized. An adult smoker should be immunized. People with an immunocompromised condition and certain other conditions should receive both PCV13 and PPSV23 vaccines. People with human immunodeficiency virus (HIV) infection should be immunized as soon as possible after diagnosis. Immunization during chemotherapy or radiation therapy should be avoided. Routine use of PPSV23 vaccine is not recommended for American Indians, [place] Natives, or people younger than 65 years unless there are medical conditions that require PPSV23 vaccine. When indicated,  people who have unknown immunization and have no record of immunization should receive PPSV23 vaccine. One-time revaccination 5 years after the first dose of PPSV23 is recommended for people aged 19 64 years who have chronic kidney failure, nephrotic syndrome, asplenia, or immunocompromised conditions. People who received 1 2 doses of PPSV23 before age 65 years should receive another dose of PPSV23 vaccine at age 65 years or later if at least 5 years have passed since the previous dose. Doses of PPSV23 are not needed for people immunized with PPSV23 at or after age 65 years.  Meningococcal vaccine. Adults with asplenia or persistent complement component deficiencies should receive 2 doses of quadrivalent meningococcal conjugate (MenACWY-D) vaccine. The doses should be obtained at least 2 months apart. Microbiologists working with certain meningococcal bacteria, military recruits, people at risk during an outbreak, and people who travel to or live in countries with a high rate of meningitis should be immunized. A first-year college student up through age 21 years who is living in a residence hall should receive a dose if she did not receive a dose on or after her 16th birthday. Adults who have certain high-risk conditions should receive one or more doses of vaccine.  Hepatitis A vaccine. Adults who wish to be protected from this disease, have certain high-risk conditions, work with hepatitis A-infected animals, work in hepatitis A research labs, or travel to or work in countries with a high rate of hepatitis A should be   immunized. Adults who were previously unvaccinated and who anticipate close contact with an international adoptee during the first 60 days after arrival in the [place] from a country with a high rate of hepatitis A should be immunized.  Hepatitis B vaccine.  Adults who wish to be protected from this disease, have certain high-risk conditions, may be exposed to blood or other infectious  body fluids, are household contacts or sex partners of hepatitis B positive people, are clients or workers in certain care facilities, or travel to or work in countries with a high rate of hepatitis B should be immunized.  Haemophilus influenzae type b (Hib) vaccine. A previously unvaccinated person with asplenia or sickle cell disease or having a scheduled splenectomy should receive 1 dose of Hib vaccine. Regardless of previous immunization, a recipient of a hematopoietic stem cell transplant should receive a 3-dose series 6 12 months after her successful transplant. Hib vaccine is not recommended for adults with HIV infection.  Preventive Services / Frequency Ages 19 to 39years  Blood pressure check.** / Every 1 to 2 years.  Lipid and cholesterol check.** / Every 5 years beginning at age 20.  Clinical breast exam.** / Every 3 years for women in their 20s and 30s.  BRCA-related cancer risk assessment.** / For women who have family members with a BRCA-related cancer (breast, ovarian, tubal, or peritoneal cancers).  Pap test.** / Every 2 years from ages 21 through 29. Every 3 years starting at age 30 through age 65 or 70 with a history of 3 consecutive normal Pap tests.  HPV screening.** / Every 3 years from ages 30 through ages 65 to 70 with a history of 3 consecutive normal Pap tests.  Hepatitis C blood test.** / For any individual with known risks for hepatitis C.  Skin self-exam. / Monthly.  Influenza vaccine. / Every year.  Tetanus, diphtheria, and acellular pertussis (Tdap, Td) vaccine.** / Consult your health care provider. Pregnant women should receive 1 dose of Tdap vaccine during each pregnancy. 1 dose of Td every 10 years.  Varicella vaccine.** / Consult your health care provider. Pregnant females who do not have evidence of immunity should receive the first dose after pregnancy.  HPV vaccine. / 3 doses over 6 months, if 26 and younger. The vaccine is not recommended for use in  pregnant females. However, pregnancy testing is not needed before receiving a dose.  Measles, mumps, rubella (MMR) vaccine.** / You need at least 1 dose of MMR if you were born in 1957 or later. You may also need a 2nd dose. For females of childbearing age, rubella immunity should be determined. If there is no evidence of immunity, females who are not pregnant should be vaccinated. If there is no evidence of immunity, females who are pregnant should delay immunization until after pregnancy.  Pneumococcal 13-valent conjugate (PCV13) vaccine.** / Consult your health care provider.  Pneumococcal polysaccharide (PPSV23) vaccine.** / 1 to 2 doses if you smoke cigarettes or if you have certain conditions.  Meningococcal vaccine.** / 1 dose if you are age 19 to 21 years and a first-year college student living in a residence hall, or have one of several medical conditions, you need to get vaccinated against meningococcal disease. You may also need additional booster doses.  Hepatitis A vaccine.** / Consult your health care provider.  Hepatitis B vaccine.** / Consult your health care provider.  Haemophilus influenzae type b (Hib) vaccine.** / Consult your health care provider.  Ages 40 to 64years    Blood pressure check.** / Every 1 to 2 years.  Lipid and cholesterol check.** / Every 5 years beginning at age 22 years.  Lung cancer screening. / Every year if you are aged 81 80 years and have a 30-pack-year history of smoking and currently smoke or have quit within the past 15 years. Yearly screening is stopped once you have quit smoking for at least 15 years or develop a health problem that would prevent you from having lung cancer treatment.  Clinical breast exam.** / Every year after age 100 years.  BRCA-related cancer risk assessment.** / For women who have family members with a BRCA-related cancer (breast, ovarian, tubal, or peritoneal cancers).  Mammogram.** / Every year beginning at age 74  years and continuing for as long as you are in good health. Consult with your health care provider.  Pap test.** / Every 3 years starting at age 28 years through age 30 or 38 years with a history of 3 consecutive normal Pap tests.  HPV screening.** / Every 3 years from ages 85 years through ages 91 to 58 years with a history of 3 consecutive normal Pap tests.  Fecal occult blood test (FOBT) of stool. / Every year beginning at age 60 years and continuing until age 55 years. You may not need to do this test if you get a colonoscopy every 10 years.  Flexible sigmoidoscopy or colonoscopy.** / Every 5 years for a flexible sigmoidoscopy or every 10 years for a colonoscopy beginning at age 3 years and continuing until age 56 years.  Hepatitis C blood test.** / For all people born from [birth year] through 1967 and any individual with known risks for hepatitis C.  Skin self-exam. / Monthly.  Influenza vaccine. / Every year.  Tetanus, diphtheria, and acellular pertussis (Tdap/Td) vaccine.** / Consult your health care provider. Pregnant women should receive 1 dose of Tdap vaccine during each pregnancy. 1 dose of Td every 10 years.  Varicella vaccine.** / Consult your health care provider. Pregnant females who do not have evidence of immunity should receive the first dose after pregnancy.  Zoster vaccine.** / 1 dose for adults aged 50 years or older.  Measles, mumps, rubella (MMR) vaccine.** / You need at least 1 dose of MMR if you were born in 1957 or later. You may also need a 2nd dose. For females of childbearing age, rubella immunity should be determined. If there is no evidence of immunity, females who are not pregnant should be vaccinated. If there is no evidence of immunity, females who are pregnant should delay immunization until after pregnancy.  Pneumococcal 13-valent conjugate (PCV13) vaccine.** / Consult your health care provider.  Pneumococcal polysaccharide (PPSV23) vaccine.** / 1 to 2 doses if  you smoke cigarettes or if you have certain conditions.  Meningococcal vaccine.** / Consult your health care provider.  Hepatitis A vaccine.** / Consult your health care provider.  Hepatitis B vaccine.** / Consult your health care provider.  Haemophilus influenzae type b (Hib) vaccine.** / Consult your health care provider.  Ages 47 years and over  Blood pressure check.** / Every 1 to 2 years.  Lipid and cholesterol check.** / Every 5 years beginning at age 55 years.  Lung cancer screening. / Every year if you are aged 57 80 years and have a 30-pack-year history of smoking and currently smoke or have quit within the past 15 years. Yearly screening is stopped once you have quit smoking for at least 15 years or develop a health problem that  would prevent you from having lung cancer treatment.  Clinical breast exam.** / Every year after age 68 years.  BRCA-related cancer risk assessment.** / For women who have family members with a BRCA-related cancer (breast, ovarian, tubal, or peritoneal cancers).  Mammogram.** / Every year beginning at age 47 years and continuing for as long as you are in good health. Consult with your health care provider.  Pap test.** / Every 3 years starting at age 59 years through age 26 or 26 years with 3 consecutive normal Pap tests. Testing can be stopped between 65 and 70 years with 3 consecutive normal Pap tests and no abnormal Pap or HPV tests in the past 10 years.  HPV screening.** / Every 3 years from ages 71 years through ages 19 or 96 years with a history of 3 consecutive normal Pap tests. Testing can be stopped between 65 and 70 years with 3 consecutive normal Pap tests and no abnormal Pap or HPV tests in the past 10 years.  Fecal occult blood test (FOBT) of stool. / Every year beginning at age 59 years and continuing until age 7 years. You may not need to do this test if you get a colonoscopy every 10 years.  Flexible sigmoidoscopy or colonoscopy.** /  Every 5 years for a flexible sigmoidoscopy or every 10 years for a colonoscopy beginning at age 28 years and continuing until age 27 years.  Hepatitis C blood test.** / For all people born from [birth year] through 1967 and any individual with known risks for hepatitis C.  Osteoporosis screening.** / A one-time screening for women ages 6 years and over and women at risk for fractures or osteoporosis.  Skin self-exam. / Monthly.  Influenza vaccine. / Every year.  Tetanus, diphtheria, and acellular pertussis (Tdap/Td) vaccine.** / 1 dose of Td every 10 years.  Varicella vaccine.** / Consult your health care provider.  Zoster vaccine.** / 1 dose for adults aged 20 years or older.  Pneumococcal 13-valent conjugate (PCV13) vaccine.** / Consult your health care provider.  Pneumococcal polysaccharide (PPSV23) vaccine.** / 1 dose for all adults aged 95 years and older.  Meningococcal vaccine.** / Consult your health care provider.  Hepatitis A vaccine.** / Consult your health care provider.  Hepatitis B vaccine.** / Consult your health care provider.  Haemophilus influenzae type b (Hib) vaccine.** / Consult your health care provider. ** Family history and personal history of risk and conditions may change your health care provider's recommendations. Document Released: 09/22/2001 Document Revised: 05/17/2013  [facility] Patient Information 2014 [place], [place].   EXERCISE AND DIET:  We recommended that you start or continue a regular exercise program for good health. Regular exercise means any activity that makes your heart beat faster and makes you sweat.  We recommend exercising at least 30 minutes per day at least 3 days a week, preferably 5.  We also recommend a diet low in fat and sugar / carbohydrates.  Inactivity, poor dietary choices and obesity can cause diabetes, heart attack, stroke, and kidney damage, among others.     ALCOHOL AND SMOKING:  Women should limit their alcohol intake to no  more than 7 drinks/beers/glasses of wine (combined, not each!) per week. Moderation of alcohol intake to this level decreases your risk of breast cancer and liver damage.  ( And of course, no recreational drugs are part of a healthy lifestyle.)  Also, you should not be smoking at all or even being exposed to second hand smoke. Most people know smoking can  cause cancer, and various heart and lung diseases, but did you know it also contributes to weakening of your bones?  Aging of your skin?  Yellowing of your teeth and nails?   CALCIUM AND VITAMIN D:  Adequate intake of calcium and Vitamin D are recommended.  The recommendations for exact amounts of these supplements seem to change often, but generally speaking 600 mg of calcium (either carbonate or citrate) and 800 units of Vitamin D per day seems prudent. Certain women may benefit from higher intake of Vitamin D.  If you are among these women, your doctor will have told you during your visit.     PAP SMEARS:  Pap smears, to check for cervical cancer or precancers,  have traditionally been done yearly, although recent scientific advances have shown that most women can have pap smears less often.  However, every woman still should have a physical exam from her gynecologist or primary care physician every year. It will include a breast check, inspection of the vulva and vagina to check for abnormal growths or skin changes, a visual exam of the cervix, and then an exam to evaluate the size and shape of the uterus and ovaries.  And after 65 years of age, a rectal exam is indicated to check for rectal cancers. We will also provide age appropriate advice regarding health maintenance, like when you should have certain vaccines, screening for sexually transmitted diseases, bone density testing, colonoscopy, mammograms, etc.    MAMMOGRAMS:  All women over 40 years old should have a yearly mammogram. Many facilities now offer a "3D" mammogram, which may cost  around $50 extra out of pocket. If possible,  we recommend you accept the option to have the 3D mammogram performed.  It both reduces the number of women who will be called back for extra views which then turn out to be normal, and it is better than the routine mammogram at detecting truly abnormal areas.     COLONOSCOPY:  Colonoscopy to screen for colon cancer is recommended for all women at age 27.  We know, you hate the idea of the prep.  We agree, BUT, having colon cancer and not knowing it is worse!!  Colon cancer so often starts as a polyp that can be seen and removed at colonscopy, which can quite literally save your life!  And if your first colonoscopy is normal and you have no family history of colon cancer, most women don't have to have it again for 10 years.  Once every ten years, you can do something that may end up saving your life, right?  We will be happy to help you get it scheduled when you are ready.  Be sure to check your insurance coverage so you understand how much it will cost.  It may be covered as a preventative service at no cost, but you should check your particular policy.

## 2019-04-13 LAB — COMPREHENSIVE METABOLIC PANEL
ALT: 12 IU/L (ref 0–32)
AST: 19 IU/L (ref 0–40)
Albumin/Globulin Ratio: 1.7 (ref 1.2–2.2)
Albumin: 4.7 g/dL (ref 3.8–4.8)
Alkaline Phosphatase: 69 IU/L (ref 39–117)
BUN/Creatinine Ratio: 19 (ref 12–28)
BUN: 17 mg/dL (ref 8–27)
Bilirubin Total: 0.4 mg/dL (ref 0.0–1.2)
CO2: 25 mmol/L (ref 20–29)
Calcium: 9.6 mg/dL (ref 8.7–10.3)
Chloride: 102 mmol/L (ref 96–106)
Creatinine, Ser: 0.9 mg/dL (ref 0.57–1.00)
GFR calc Af Amer: 78 mL/min/{1.73_m2} (ref >59)
GFR calc non Af Amer: 68 mL/min/{1.73_m2} (ref >59)
Globulin, Total: 2.8 g/dL (ref 1.5–4.5)
Glucose: 92 mg/dL (ref 65–99)
Potassium: 4.7 mmol/L (ref 3.5–5.2)
Sodium: 140 mmol/L (ref 134–144)
Total Protein: 7.5 g/dL (ref 6.0–8.5)

## 2019-04-13 LAB — CBC WITH DIFFERENTIAL/PLATELET
Basophils Absolute: 0 10*3/uL (ref 0.0–0.2)
Basos: 1 %
EOS (ABSOLUTE): 0.1 10*3/uL (ref 0.0–0.4)
Eos: 2 %
Hematocrit: 40 % (ref 34.0–46.6)
Hemoglobin: 13.2 g/dL (ref 11.1–15.9)
Immature Grans (Abs): 0 10*3/uL (ref 0.0–0.1)
Immature Granulocytes: 0 %
Lymphocytes Absolute: 2.3 10*3/uL (ref 0.7–3.1)
Lymphs: 39 %
MCH: 30.6 pg (ref 26.6–33.0)
MCHC: 33 g/dL (ref 31.5–35.7)
MCV: 93 fL (ref 79–97)
Monocytes Absolute: 0.5 10*3/uL (ref 0.1–0.9)
Monocytes: 9 %
Neutrophils Absolute: 2.9 10*3/uL (ref 1.4–7.0)
Neutrophils: 49 %
Platelets: 248 10*3/uL (ref 150–450)
RBC: 4.32 x10E6/uL (ref 3.77–5.28)
RDW: 12.3 % (ref 11.7–15.4)
WBC: 5.9 10*3/uL (ref 3.4–10.8)

## 2019-04-13 LAB — T3: T3, Total: 96 ng/dL (ref 71–180)

## 2019-04-13 LAB — TSH: TSH: 2.44 u[IU]/mL (ref 0.450–4.500)

## 2019-04-13 LAB — LIPID PANEL WITH LDL/HDL RATIO
Cholesterol, Total: 225 mg/dL — ABNORMAL HIGH (ref 100–199)
HDL: 61 mg/dL (ref >39)
LDL Chol Calc (NIH): 147 mg/dL — ABNORMAL HIGH (ref 0–99)
LDL/HDL Ratio: 2.4 ratio (ref 0.0–3.2)
Triglycerides: 99 mg/dL (ref 0–149)
VLDL Cholesterol Cal: 17 mg/dL (ref 5–40)

## 2019-04-13 LAB — HEMOGLOBIN A1C
Est. average glucose Bld gHb Est-mCnc: 111 mg/dL
Hgb A1c MFr Bld: 5.5 % (ref 4.8–5.6)

## 2019-04-13 LAB — CYTOLOGY - PAP: Diagnosis: NEGATIVE

## 2019-04-13 LAB — VITAMIN D 25 HYDROXY (VIT D DEFICIENCY, FRACTURES): Vit D, 25-Hydroxy: 55.9 ng/mL (ref 30.0–100.0)

## 2019-04-13 LAB — T4, FREE: Free T4: 1.28 ng/dL (ref 0.82–1.77)

## 2019-06-12 ENCOUNTER — Ambulatory Visit (INDEPENDENT_AMBULATORY_CARE_PROVIDER_SITE_OTHER): Payer: Medicare Other

## 2019-06-12 ENCOUNTER — Other Ambulatory Visit: Payer: Self-pay

## 2019-06-12 DIAGNOSIS — Z23 Encounter for immunization: Secondary | ICD-10-CM

## 2019-06-12 NOTE — Progress Notes (Signed)
Pt here for Shingrix vaccine #2.  Screening questionnaire reviewed, VIS provided to patient, and any/all patient questions answered.  Charyl Bigger, CMA

## 2019-09-08 DIAGNOSIS — D2271 Melanocytic nevi of right lower limb, including hip: Secondary | ICD-10-CM | POA: Diagnosis not present

## 2019-09-08 DIAGNOSIS — D2261 Melanocytic nevi of right upper limb, including shoulder: Secondary | ICD-10-CM | POA: Diagnosis not present

## 2019-09-08 DIAGNOSIS — L738 Other specified follicular disorders: Secondary | ICD-10-CM | POA: Diagnosis not present

## 2019-09-08 DIAGNOSIS — R238 Other skin changes: Secondary | ICD-10-CM | POA: Diagnosis not present

## 2019-09-08 DIAGNOSIS — Z85828 Personal history of other malignant neoplasm of skin: Secondary | ICD-10-CM | POA: Diagnosis not present

## 2019-09-08 DIAGNOSIS — Z808 Family history of malignant neoplasm of other organs or systems: Secondary | ICD-10-CM | POA: Diagnosis not present

## 2019-09-08 DIAGNOSIS — L821 Other seborrheic keratosis: Secondary | ICD-10-CM | POA: Diagnosis not present

## 2019-09-08 DIAGNOSIS — Z86018 Personal history of other benign neoplasm: Secondary | ICD-10-CM | POA: Diagnosis not present

## 2019-09-08 DIAGNOSIS — Z85820 Personal history of malignant melanoma of skin: Secondary | ICD-10-CM | POA: Diagnosis not present

## 2020-01-22 DIAGNOSIS — R238 Other skin changes: Secondary | ICD-10-CM | POA: Diagnosis not present

## 2020-01-22 DIAGNOSIS — D225 Melanocytic nevi of trunk: Secondary | ICD-10-CM | POA: Diagnosis not present

## 2020-01-22 DIAGNOSIS — Z85820 Personal history of malignant melanoma of skin: Secondary | ICD-10-CM | POA: Diagnosis not present

## 2020-01-22 DIAGNOSIS — L821 Other seborrheic keratosis: Secondary | ICD-10-CM | POA: Diagnosis not present

## 2020-01-22 DIAGNOSIS — Z85828 Personal history of other malignant neoplasm of skin: Secondary | ICD-10-CM | POA: Diagnosis not present

## 2020-01-22 DIAGNOSIS — L578 Other skin changes due to chronic exposure to nonionizing radiation: Secondary | ICD-10-CM | POA: Diagnosis not present

## 2020-01-22 DIAGNOSIS — L738 Other specified follicular disorders: Secondary | ICD-10-CM | POA: Diagnosis not present

## 2020-01-22 DIAGNOSIS — Z808 Family history of malignant neoplasm of other organs or systems: Secondary | ICD-10-CM | POA: Diagnosis not present

## 2020-01-22 DIAGNOSIS — Z86018 Personal history of other benign neoplasm: Secondary | ICD-10-CM | POA: Diagnosis not present

## 2020-01-22 DIAGNOSIS — L820 Inflamed seborrheic keratosis: Secondary | ICD-10-CM | POA: Diagnosis not present

## 2020-04-24 ENCOUNTER — Other Ambulatory Visit: Payer: Self-pay | Admitting: Physician Assistant

## 2020-04-24 DIAGNOSIS — Z1231 Encounter for screening mammogram for malignant neoplasm of breast: Secondary | ICD-10-CM

## 2020-05-09 ENCOUNTER — Ambulatory Visit
Admission: RE | Admit: 2020-05-09 | Discharge: 2020-05-09 | Disposition: A | Discharge status: Home / Self Care | Payer: Medicare PPO | Source: Ambulatory Visit | Attending: Physician Assistant | Admitting: Physician Assistant

## 2020-05-09 ENCOUNTER — Other Ambulatory Visit: Payer: Self-pay

## 2020-05-09 DIAGNOSIS — Z1231 Encounter for screening mammogram for malignant neoplasm of breast: Secondary | ICD-10-CM | POA: Diagnosis not present

## 2020-06-06 ENCOUNTER — Other Ambulatory Visit: Payer: Medicare PPO

## 2020-06-06 ENCOUNTER — Other Ambulatory Visit: Payer: Self-pay

## 2020-06-06 DIAGNOSIS — E7800 Pure hypercholesterolemia, unspecified: Secondary | ICD-10-CM | POA: Diagnosis not present

## 2020-06-06 DIAGNOSIS — Z0000 Encounter for general adult medical examination without abnormal findings: Secondary | ICD-10-CM | POA: Diagnosis not present

## 2020-06-07 LAB — HEMOGLOBIN A1C
Est. average glucose Bld gHb Est-mCnc: 123 mg/dL
Hgb A1c MFr Bld: 5.9 % — ABNORMAL HIGH (ref 4.8–5.6)

## 2020-06-07 LAB — COMPREHENSIVE METABOLIC PANEL
ALT: 14 IU/L (ref 0–32)
AST: 21 IU/L (ref 0–40)
Albumin/Globulin Ratio: 1.7 (ref 1.2–2.2)
Albumin: 4.6 g/dL (ref 3.8–4.8)
Alkaline Phosphatase: 74 IU/L (ref 44–121)
BUN/Creatinine Ratio: 23 (ref 12–28)
BUN: 17 mg/dL (ref 8–27)
Bilirubin Total: 0.3 mg/dL (ref 0.0–1.2)
CO2: 25 mmol/L (ref 20–29)
Calcium: 9.5 mg/dL (ref 8.7–10.3)
Chloride: 103 mmol/L (ref 96–106)
Creatinine, Ser: 0.74 mg/dL (ref 0.57–1.00)
GFR calc Af Amer: 98 mL/min/{1.73_m2} (ref >59)
GFR calc non Af Amer: 85 mL/min/{1.73_m2} (ref >59)
Globulin, Total: 2.7 g/dL (ref 1.5–4.5)
Glucose: 95 mg/dL (ref 65–99)
Potassium: 4.2 mmol/L (ref 3.5–5.2)
Sodium: 141 mmol/L (ref 134–144)
Total Protein: 7.3 g/dL (ref 6.0–8.5)

## 2020-06-07 LAB — CBC
Hematocrit: 41 % (ref 34.0–46.6)
Hemoglobin: 13.3 g/dL (ref 11.1–15.9)
MCH: 30.2 pg (ref 26.6–33.0)
MCHC: 32.4 g/dL (ref 31.5–35.7)
MCV: 93 fL (ref 79–97)
Platelets: 263 10*3/uL (ref 150–450)
RBC: 4.4 x10E6/uL (ref 3.77–5.28)
RDW: 12.4 % (ref 11.7–15.4)
WBC: 6.2 10*3/uL (ref 3.4–10.8)

## 2020-06-07 LAB — LIPID PANEL
Chol/HDL Ratio: 3.7 ratio (ref 0.0–4.4)
Cholesterol, Total: 223 mg/dL — ABNORMAL HIGH (ref 100–199)
HDL: 61 mg/dL (ref >39)
LDL Chol Calc (NIH): 147 mg/dL — ABNORMAL HIGH (ref 0–99)
Triglycerides: 85 mg/dL (ref 0–149)
VLDL Cholesterol Cal: 15 mg/dL (ref 5–40)

## 2020-06-07 LAB — TSH: TSH: 2.23 u[IU]/mL (ref 0.450–4.500)

## 2020-06-11 ENCOUNTER — Ambulatory Visit (INDEPENDENT_AMBULATORY_CARE_PROVIDER_SITE_OTHER): Payer: Medicare PPO | Admitting: Physician Assistant

## 2020-06-11 ENCOUNTER — Other Ambulatory Visit: Payer: Self-pay

## 2020-06-11 ENCOUNTER — Encounter: Payer: Self-pay | Admitting: Physician Assistant

## 2020-06-11 VITALS — BP 146/80 | HR 70 | Temp 97.5°F | Ht 61.25 in | Wt 135.0 lb

## 2020-06-11 DIAGNOSIS — E663 Overweight: Secondary | ICD-10-CM

## 2020-06-11 DIAGNOSIS — R7302 Impaired glucose tolerance (oral): Secondary | ICD-10-CM

## 2020-06-11 DIAGNOSIS — Z2821 Immunization not carried out because of patient refusal: Secondary | ICD-10-CM

## 2020-06-11 DIAGNOSIS — Z0000 Encounter for general adult medical examination without abnormal findings: Secondary | ICD-10-CM

## 2020-06-11 DIAGNOSIS — E7800 Pure hypercholesterolemia, unspecified: Secondary | ICD-10-CM

## 2020-06-11 DIAGNOSIS — Z23 Encounter for immunization: Secondary | ICD-10-CM | POA: Diagnosis not present

## 2020-06-11 DIAGNOSIS — Z6825 Body mass index (BMI) 25.0-25.9, adult: Secondary | ICD-10-CM

## 2020-06-11 MED ORDER — PNEUMOCOCCAL 13-VAL CONJ VACC IM SUSP: 0.5000 mL | INTRAMUSCULAR | 0 refills | Status: AC

## 2020-06-11 NOTE — Progress Notes (Signed)
Female Physical   Impression and Recommendations:    1. Healthcare maintenance   2. Need for pneumococcal vaccination   3. Elevated LDL cholesterol level   4. Glucose intolerance (impaired glucose tolerance) A1c 5.9 January '18   5. Influenza vaccination declined   6. Overweight with body mass index (BMI) of 25 to 25.9 in adult      1) Anticipatory Guidance: Discussed skin CA prevention and sunscreen when outside along with skin surveillance; eating a balanced and modest diet; physical activity at least 25 minutes per day or minimum of 150 min/ week moderate to intense activity.  2) Immunizations / Screenings / Labs:   All immunizations are up-to-date per recommendations or will be updated today if pt allows.    - Patient understands with dental and vision screens they will schedule independently.  - Obtained CBC, CMP, HgA1c, Lipid panel, and TSH when fasting. Most labs are essentially wnl with the exception of lipid panel and A1c. Total cholesterol and LDL remain elevated. A1c in prediabetes range. - UTD on mammogram, pap smear, colonoscopy, Tdap, Hep C and HIV screenings. - Declined influenza vaccine. - Agreeable to Prevnar 13. Will send rx.  3) Weight:  Discussed goal to improve diet habits to improve overall feelings of well being and objective health data. Improve nutrient density of diet through increasing intake of fruits and vegetables and decreasing saturated fats, white flour products and refined sugars.  4) Healthcare Maintenance: -Discussed with patient management options for hyperlipidemia and patient prefers to continue with a heart healthy diet and exercise. Declines statin therapy at this time. The 10-year ASCVD risk score Mikey Bussing DC Brooke Bonito., et al., 2013) is: 8.1%   Values used to calculate the score:     Age: 64 years     Sex: Female     Is Non-Hispanic African American: No     Diabetic: No     Tobacco smoker: No     Systolic Blood Pressure: 644 mmHg     Is BP  treated: No     HDL Cholesterol: 61 mg/dL     Total Cholesterol: 223 mg/dL  -Reports increased personal stress which could possibly contributed to increased A1c. Recommend to monitor carbohydrates and glucose. -Stay well hydrated.  -BP mildly elevated. Prior BP wnl.  -Recommend using debrox drops to help soften earwax and for removal. -Follow up in 1 year for CPE and FBW. Recommend repeating lipid panel in 6 months (lab visit).   Meds ordered this encounter  Medications   pneumococcal 13-valent conjugate vaccine (PREVNAR 13) SUSP injection    Sig: Inject 0.5 mLs into the muscle tomorrow at 10 am for 1 dose.    Dispense:  0.5 mL    Refill:  0    No orders of the defined types were placed in this encounter.    Return in about 1 year (around 06/11/2021) for CPE and FBW; lab visit in 6 months to repeat lipid panel.     Gross side effects, risk and benefits, and alternatives of medications discussed with patient.  Patient is aware that all medications have potential side effects and we are unable to predict every side effect or drug-drug interaction that may occur.  Expresses verbal understanding and consents to current therapy plan and treatment regimen.    Please see orders placed and AVS handed out to patient at the end of our visit for further patient instructions/ counseling done pertaining to today's office visit.  Note:  This note was prepared  with assistance of Systems analyst. Occasional wrong-word or sound-a-like substitutions may have occurred due to the inherent limitations of voice recognition software.    Subjective:     CPE HPI: Jordan Wade is a 64 y.o. female who presents to [facility] today a yearly health maintenance exam.   Health Maintenance Summary  - Reviewed and updated, unless pt declines services.  Last Cologuard or Colonoscopy:   05/27/2018- repeat in 5 yrs Tobacco History Reviewed:  Y, never a  smoker Alcohol and/or drug use:    No concerns; no use Exercise Habits:   Moderate physical activity Dental Home:Y Eye exams:Y Dermatology home:Y  Female Health:  PAP Smear - last known results:  04/12/2019- normal STD concerns:   none Lumps or breast concerns: none Breast Cancer Family History:  Y, paternal aunt   Additional concerns beyond health maintenance issues: none    Immunization History  Administered Date(s) Administered   Influenza-Unspecified 04/10/2016, 04/10/2017, 05/10/2018   PFIZER SARS-COV-2 Vaccination 10/12/2019, 11/02/2019   Tdap 08/14/2015   Zoster 08/11/2015   Zoster Recombinat (Shingrix) 04/12/2019, 06/12/2019     Health Maintenance  Topic Date Due   DEXA SCAN  09/07/2018   PNA vac Low Risk Adult (1 of 2 - PCV13) Never done   INFLUENZA VACCINE  03/10/2020   MAMMOGRAM  05/09/2022   COLONOSCOPY  05/28/2023   TETANUS/TDAP  08/13/2025   COVID-19 Vaccine  Completed   Hepatitis C Screening  Completed     Wt Readings from Last 3 Encounters:  06/11/20 135 lb (61.2 kg)  04/12/19 139 lb 3.2 oz (63.1 kg)  10/18/18 138 lb 3.7 oz (62.7 kg)   BP Readings from Last 3 Encounters:  06/11/20 (!) 146/80  04/12/19 132/70  10/18/18 124/74   Pulse Readings from Last 3 Encounters:  06/11/20 70  04/12/19 72  10/18/18 (!) 59     Past Medical History:  Diagnosis Date   Arthritis    hands   Carpal tunnel syndrome of right wrist    PONV (postoperative nausea and vomiting)    Trigger thumb of right hand       Past Surgical History:  Procedure Laterality Date   BILATERAL ANTERIOR TOTAL HIP ARTHROPLASTY Bilateral 11/04/2012   Procedure: BILATERAL ANTERIOR TOTAL HIP ARTHROPLASTY;  Surgeon: Mcarthur Rossetti, MD;  Location: [facility];  Service: Orthopedics;  Laterality: Bilateral;   CARPAL TUNNEL RELEASE Right 10/18/2018   Procedure: CARPAL TUNNEL RELEASE;  Surgeon: Daryll Brod, MD;  Location: [facility];  Service:  Orthopedics;  Laterality: Right;  FAB   CESAREAN SECTION     x 2   JOINT REPLACEMENT     TRIGGER FINGER RELEASE Right 10/18/2018   Procedure: RELEASE TRIGGER FINGER/A-1 PULLEY;  Surgeon: Daryll Brod, MD;  Location: [facility];  Service: Orthopedics;  Laterality: Right;  FAB      Family History  Problem Relation Age of Onset   Hyperlipidemia Mother    Stroke Mother    Colon cancer Mother    Hypertension Father    Diabetes Maternal Grandfather    Colon cancer Maternal Grandfather    Heart attack Maternal Grandfather    Heart disease Maternal Grandfather    Colon cancer Cousin        and paternal greatgrandfather   Breast cancer Paternal Aunt        apprx. 45      Social History   Substance and Sexual Activity  Drug Use No  ,   Social History   Substance and Sexual Activity  Alcohol Use No  ,   Social History   Tobacco Use  Smoking Status Never Smoker  Smokeless Tobacco Never Used  ,   Social History   Substance and Sexual Activity  Sexual Activity Not on file    Current Outpatient Medications on File Prior to Visit  Medication Sig Dispense Refill   aspirin 81 MG chewable tablet Chew 81 mg by mouth daily.     Calcium Carb-Cholecalciferol [id number] MG-UNIT CAPS Take 1 capsule by mouth 2 (two) times daily.      No current facility-administered medications on file prior to visit.    Allergies: Patient has no known allergies.  Review of Systems: General:   Denies fever, chills, unexplained weight loss.  Optho/Auditory:   Denies visual changes, blurred vision/LOV Respiratory:   Denies SOB, DOE more than baseline levels.   Cardiovascular:   Denies chest pain, palpitations, new onset peripheral edema  Gastrointestinal:   Denies nausea, vomiting, diarrhea.  Genitourinary: Denies dysuria, freq/ urgency, flank pain or discharge from genitals.  Endocrine:     Denies hot or cold intolerance, polyuria, polydipsia. Musculoskeletal:    Denies unexplained myalgias, joint swelling, unexplained arthralgias, gait problems.  Skin:  Denies rash, suspicious lesions Neurological:     Denies dizziness, unexplained weakness, numbness  Psychiatric/Behavioral:   Denies mood changes, suicidal or homicidal ideations, hallucinations    Objective:    Blood pressure (!) 146/80, pulse 70, temperature (!) 97.5 F (36.4 C), temperature source Oral, height 5' 1.25" (1.556 m), weight 135 lb (61.2 kg), SpO2 99 %. Body mass index is 25.3 kg/m. General Appearance:    Alert, cooperative, no distress, appears stated age  Head:    Normocephalic, without obvious abnormality, atraumatic  Eyes:    PERRL, conjunctiva/corneas clear, EOM's intact, both eyes  Ears:    Normal TM's and external ear canals, righ ear; cerumen impaction of right ear  Nose:   Nares normal, septum midline, mucosa normal, no drainage    or sinus tenderness  Throat:   Lips w/o lesion, mucosa moist, and tongue normal; teeth and   gums normal  Neck:   Supple, symmetrical, trachea midline, no adenopathy;    thyroid:  no enlargement/tenderness/nodules; no JVD  Back:     Symmetric, no curvature, ROM normal, no CVA tenderness  Lungs:     Clear to auscultation bilaterally, respirations unlabored, no       Wh/ R/ R  Chest Wall:    No tenderness or gross deformity; normal excursion   Heart:    Regular rate and rhythm, S1 and S2 normal, no murmur, rub   or gallop  Breast Exam:    Deferred. Recent mammogram wnl, no concerns/sxs.  Abdomen:     Soft, non-tender, bowel sounds active all four quadrants, No G/R/R, no masses, no organomegaly  Genitalia:   Deferred. No concerns/sxs.   Rectal:   Deferred. No concerns/sxs.   Extremities:   Extremities normal, atraumatic, no cyanosis or gross edema  Pulses:   2+ and symmetric all extremities  Skin:   Warm, dry, Skin color, texture, turgor normal, no obvious rashes or lesions Psych: No HI/SI, judgement and insight good, Euthymic mood. Full  Affect.  Neurologic:   CNII-XII grossly intact, normal strength, sensation and reflexes throughout

## 2020-06-11 NOTE — Patient Instructions (Signed)

## 2020-08-23 DIAGNOSIS — Z808 Family history of malignant neoplasm of other organs or systems: Secondary | ICD-10-CM | POA: Diagnosis not present

## 2020-08-23 DIAGNOSIS — L820 Inflamed seborrheic keratosis: Secondary | ICD-10-CM | POA: Diagnosis not present

## 2020-08-23 DIAGNOSIS — D225 Melanocytic nevi of trunk: Secondary | ICD-10-CM | POA: Diagnosis not present

## 2020-08-23 DIAGNOSIS — Z85828 Personal history of other malignant neoplasm of skin: Secondary | ICD-10-CM | POA: Diagnosis not present

## 2020-08-23 DIAGNOSIS — Z85820 Personal history of malignant melanoma of skin: Secondary | ICD-10-CM | POA: Diagnosis not present

## 2020-08-23 DIAGNOSIS — R238 Other skin changes: Secondary | ICD-10-CM | POA: Diagnosis not present

## 2020-08-23 DIAGNOSIS — D2261 Melanocytic nevi of right upper limb, including shoulder: Secondary | ICD-10-CM | POA: Diagnosis not present

## 2020-08-23 DIAGNOSIS — D2271 Melanocytic nevi of right lower limb, including hip: Secondary | ICD-10-CM | POA: Diagnosis not present

## 2020-08-23 DIAGNOSIS — Z86018 Personal history of other benign neoplasm: Secondary | ICD-10-CM | POA: Diagnosis not present

## 2020-08-23 DIAGNOSIS — L821 Other seborrheic keratosis: Secondary | ICD-10-CM | POA: Diagnosis not present

## 2020-12-05 ENCOUNTER — Other Ambulatory Visit: Payer: Self-pay | Admitting: Physician Assistant

## 2020-12-05 DIAGNOSIS — E7800 Pure hypercholesterolemia, unspecified: Secondary | ICD-10-CM

## 2020-12-09 ENCOUNTER — Other Ambulatory Visit: Payer: Self-pay

## 2020-12-09 ENCOUNTER — Other Ambulatory Visit: Payer: Medicare PPO

## 2020-12-09 DIAGNOSIS — E7800 Pure hypercholesterolemia, unspecified: Secondary | ICD-10-CM | POA: Diagnosis not present

## 2020-12-10 LAB — LIPID PANEL
Chol/HDL Ratio: 3.7 ratio (ref 0.0–4.4)
Cholesterol, Total: 213 mg/dL — ABNORMAL HIGH (ref 100–199)
HDL: 57 mg/dL (ref >39)
LDL Chol Calc (NIH): 140 mg/dL — ABNORMAL HIGH (ref 0–99)
Triglycerides: 92 mg/dL (ref 0–149)
VLDL Cholesterol Cal: 16 mg/dL (ref 5–40)

## 2021-02-13 ENCOUNTER — Ambulatory Visit: Payer: Medicare PPO | Admitting: Family Medicine

## 2021-02-13 ENCOUNTER — Other Ambulatory Visit: Payer: Self-pay

## 2021-02-13 ENCOUNTER — Encounter: Payer: Self-pay | Admitting: Family Medicine

## 2021-02-13 DIAGNOSIS — G8929 Other chronic pain: Secondary | ICD-10-CM

## 2021-02-13 DIAGNOSIS — M25512 Pain in left shoulder: Secondary | ICD-10-CM | POA: Diagnosis not present

## 2021-02-13 NOTE — Progress Notes (Signed)
Office Visit Note   Patient: Jordan Wade           Date of Birth: Dec 01, 1955           MRN: [id number] Visit Date: 02/13/2021 Requested by: Lorrene Reid, PA-C [address] [place],  [place] [address] PCP: Lorrene Reid, PA-C  Subjective: Chief Complaint  Patient presents with   Left Shoulder - Pain    Intermittent pain since the last visit for this in 2019 (saw Dr. Ninfa Linden). This was worse over the past weekend. Catching sensations with certain arm motions. When she was seen before, she had a cortisone injection and PT (with dry needling) -- these helped.    HPI: She is here with left shoulder pain.  I have seen her in the past for both hips, she underwent bilateral replacement and is doing well from that standpoint.  Her shoulder started bothering her years ago.  In 2019 she saw Dr. Ninfa Linden and was given a subacromial injection which helped, followed by physical therapy with dry needling which also helped and gave her better range of motion.  She has done pretty well since then with intermittent pain but the pain has been tolerable.  This past weekend it flared up significantly and she now presents for evaluation.  Today it is much better.  She has not taken medication for her pain.  She has pain in the trapezius area and lateral shoulder.  She has noticed some weakness in her arm.  She is right-hand dominant.                ROS:   All other systems were reviewed and are negative.  Objective: Vital Signs: There were no vitals taken for this visit.  Physical Exam:  General:  Alert and oriented, in no acute distress. Pulm:  Breathing unlabored. Psy:  Normal mood, congruent affect  Left shoulder: She has full active range of motion, no adhesive capsulitis.  Neck range of motion is also full, and she has negative Spurling's test.  She has tender trigger points in the trapezius belly, the rhomboid area, and the left C4-5 paraspinous muscles.  Upper extremity strength  and reflexes are normal except for shoulder external rotation which is substantially weak.  It does not cause severe pain when she does this, but it is very weak.   Imaging: No results found.  Assessment & Plan: Chronic left shoulder pain with external rotation weakness, cannot rule out infraspinatus tear, paralabral cyst with nerve compression, cervical disc protrusion with nerve impingement. -We will try physical therapy again at [facility].  If pain and weakness persist, then MRI of the shoulder.  If MRI is unrevealing, then cervical spine     Procedures: No procedures performed        PMFS History: Patient Active Problem List   Diagnosis Date Noted   Diarrhea 01/31/2018   Bronchitis 09/27/2017   Impingement syndrome of right shoulder 09/21/2017   Glucose intolerance (impaired glucose tolerance) A1c 5.9 January '18 08/21/2016   Elevated LDL cholesterol level 08/10/2016   History of osteopenia 04/17/2016   Generalized OA 04/17/2016   Family history of colon cancer-  mom age 65 04/17/2016   Family history of mixed hyperlipidemia 04/17/2016   Family history of stroke or transient ischemic attack in mother 04/17/2016   H/O bilateral hip replacements 04/17/2016   Past Medical History:  Diagnosis Date   Arthritis    hands   Carpal tunnel syndrome of right  wrist    PONV (postoperative nausea and vomiting)    Trigger thumb of right hand     Family History  Problem Relation Age of Onset   Hyperlipidemia Mother    Stroke Mother    Colon cancer Mother    Hypertension Father    Diabetes Maternal Grandfather    Colon cancer Maternal Grandfather    Heart attack Maternal Grandfather    Heart disease Maternal Grandfather    Colon cancer Cousin        and paternal greatgrandfather   Breast cancer Paternal Aunt        apprx. 9    Past Surgical History:  Procedure Laterality Date   BILATERAL ANTERIOR TOTAL HIP ARTHROPLASTY Bilateral 11/04/2012   Procedure: BILATERAL  ANTERIOR TOTAL HIP ARTHROPLASTY;  Surgeon: Mcarthur Rossetti, MD;  Location: [facility];  Service: Orthopedics;  Laterality: Bilateral;   CARPAL TUNNEL RELEASE Right 10/18/2018   Procedure: CARPAL TUNNEL RELEASE;  Surgeon: Daryll Brod, MD;  Location: [place];  Service: Orthopedics;  Laterality: Right;  FAB   CESAREAN SECTION     x 2   JOINT REPLACEMENT     TRIGGER FINGER RELEASE Right 10/18/2018   Procedure: RELEASE TRIGGER FINGER/A-1 PULLEY;  Surgeon: Daryll Brod, MD;  Location: [place];  Service: Orthopedics;  Laterality: Right;  FAB   Social History   Occupational History   Occupation: retired Pharmacist, hospital  Tobacco Use   Smoking status: Never   Smokeless tobacco: Never  Vaping Use   Vaping Use: Never used  Substance and Sexual Activity   Alcohol use: No   Drug use: No   Sexual activity: Not on file

## 2021-02-26 DIAGNOSIS — M25512 Pain in left shoulder: Secondary | ICD-10-CM | POA: Diagnosis not present

## 2021-03-11 DIAGNOSIS — M25512 Pain in left shoulder: Secondary | ICD-10-CM | POA: Diagnosis not present

## 2021-03-12 DIAGNOSIS — D2261 Melanocytic nevi of right upper limb, including shoulder: Secondary | ICD-10-CM | POA: Diagnosis not present

## 2021-03-12 DIAGNOSIS — L821 Other seborrheic keratosis: Secondary | ICD-10-CM | POA: Diagnosis not present

## 2021-03-12 DIAGNOSIS — Z85828 Personal history of other malignant neoplasm of skin: Secondary | ICD-10-CM | POA: Diagnosis not present

## 2021-03-12 DIAGNOSIS — Z85820 Personal history of malignant melanoma of skin: Secondary | ICD-10-CM | POA: Diagnosis not present

## 2021-03-12 DIAGNOSIS — D2271 Melanocytic nevi of right lower limb, including hip: Secondary | ICD-10-CM | POA: Diagnosis not present

## 2021-03-12 DIAGNOSIS — D225 Melanocytic nevi of trunk: Secondary | ICD-10-CM | POA: Diagnosis not present

## 2021-03-12 DIAGNOSIS — R238 Other skin changes: Secondary | ICD-10-CM | POA: Diagnosis not present

## 2021-03-12 DIAGNOSIS — Z86018 Personal history of other benign neoplasm: Secondary | ICD-10-CM | POA: Diagnosis not present

## 2021-03-12 DIAGNOSIS — Z808 Family history of malignant neoplasm of other organs or systems: Secondary | ICD-10-CM | POA: Diagnosis not present

## 2021-03-13 DIAGNOSIS — M25512 Pain in left shoulder: Secondary | ICD-10-CM | POA: Diagnosis not present

## 2021-03-18 DIAGNOSIS — M25512 Pain in left shoulder: Secondary | ICD-10-CM | POA: Diagnosis not present

## 2021-04-03 DIAGNOSIS — M25512 Pain in left shoulder: Secondary | ICD-10-CM | POA: Diagnosis not present

## 2021-04-10 DIAGNOSIS — M25512 Pain in left shoulder: Secondary | ICD-10-CM | POA: Diagnosis not present

## 2021-04-16 DIAGNOSIS — M25512 Pain in left shoulder: Secondary | ICD-10-CM | POA: Diagnosis not present

## 2021-04-28 DIAGNOSIS — M25512 Pain in left shoulder: Secondary | ICD-10-CM | POA: Diagnosis not present

## 2021-04-30 ENCOUNTER — Telehealth: Payer: Self-pay | Admitting: Physician Assistant

## 2021-04-30 NOTE — Telephone Encounter (Signed)
LVM for pt to call the office to schedule AWV with [facility]. Please schedule this appt if pt calls the office.   Thanks

## 2021-05-05 DIAGNOSIS — M25512 Pain in left shoulder: Secondary | ICD-10-CM | POA: Diagnosis not present

## 2021-05-05 DIAGNOSIS — M19012 Primary osteoarthritis, left shoulder: Secondary | ICD-10-CM | POA: Diagnosis not present

## 2021-05-06 ENCOUNTER — Other Ambulatory Visit: Payer: Self-pay | Admitting: Physician Assistant

## 2021-05-06 DIAGNOSIS — Z1231 Encounter for screening mammogram for malignant neoplasm of breast: Secondary | ICD-10-CM

## 2021-05-09 ENCOUNTER — Ambulatory Visit (INDEPENDENT_AMBULATORY_CARE_PROVIDER_SITE_OTHER): Payer: Medicare PPO | Admitting: Physician Assistant

## 2021-05-09 ENCOUNTER — Encounter: Payer: Self-pay | Admitting: Physician Assistant

## 2021-05-09 VITALS — HR 85 | Ht 61.0 in | Wt 132.0 lb

## 2021-05-09 DIAGNOSIS — Z0000 Encounter for general adult medical examination without abnormal findings: Secondary | ICD-10-CM

## 2021-05-09 DIAGNOSIS — Z13820 Encounter for screening for osteoporosis: Secondary | ICD-10-CM | POA: Diagnosis not present

## 2021-05-09 DIAGNOSIS — Z780 Asymptomatic menopausal state: Secondary | ICD-10-CM

## 2021-05-09 NOTE — Progress Notes (Addendum)
Virtual Visit via Telephone Note:  I connected with Jordan Wade by telephone and verified that I am speaking with the correct person using two identifiers.    I discussed the limitations, risks, security and privacy concerns for performing an evaluation and management service by telephone and the availability of in person appointments. The staff discussed with patient that there may be a patient responsible charge related to this service. The patient expressed understanding and agreed to proceed.   Location of Patient- Home Location of Provider- Office   Subjective:   Jordan Wade is a 65 y.o. female who presents for Medicare Annual (Subsequent) preventive examination.  Review of Systems    Review of Systems: General:   No F/C, wt loss Pulm:   No DIB, SOB, pleuritic chest pain Card:  No CP, palpitations Abd:  No n/v/d or pain Ext:  No edema     Objective:    Today's Vitals   05/09/21 1027  Pulse: 85  Weight: 132 lb (59.9 kg)  Height: 5\' 1"  (1.549 m)   Body mass index is 24.94 kg/m.  Advanced Directives 10/18/2018 10/10/2018 02/18/2017 04/15/2016 11/07/2012 11/04/2012 10/28/2012  Does Patient Have a Medical Advance Directive? Yes Yes Yes Yes - Patient has advance directive, copy not in chart Patient has advance directive, copy not in chart  Type of Advance Directive [place];Living will [place];Living will [place];Living will [place]  Does patient want to make changes to medical advance directive? No - Patient declined No - Patient declined - - - - -  Copy of [place] in Chart? No - copy requested - - - - Copy requested from other (Comment) Copy requested from family  Pre-existing out of facility DNR order (yellow form or pink MOST form) - - - - No No -    Current Medications  (verified) Outpatient Encounter Medications as of 05/09/2021  Medication Sig   aspirin EC 81 MG tablet Take 81 mg by mouth daily. Swallow whole.   Calcium Carb-Cholecalciferol [phone] MG-UNIT CAPS Take 1 capsule by mouth 2 (two) times daily.    [DISCONTINUED] naproxen sodium (ALEVE) 220 MG tablet Take 220 mg by mouth 2 (two) times daily as needed.   No facility-administered encounter medications on file as of 05/09/2021.    Allergies (verified) Patient has no known allergies.   History: Past Medical History:  Diagnosis Date   Arthritis    hands   Carpal tunnel syndrome of right wrist    PONV (postoperative nausea and vomiting)    Trigger thumb of right hand    Past Surgical History:  Procedure Laterality Date   BILATERAL ANTERIOR TOTAL HIP ARTHROPLASTY Bilateral 11/04/2012   Procedure: BILATERAL ANTERIOR TOTAL HIP ARTHROPLASTY;  Surgeon: Mcarthur Rossetti, MD;  Location: [facility];  Service: Orthopedics;  Laterality: Bilateral;   CARPAL TUNNEL RELEASE Right 10/18/2018   Procedure: CARPAL TUNNEL RELEASE;  Surgeon: Daryll Brod, MD;  Location: [place];  Service: Orthopedics;  Laterality: Right;  FAB   CESAREAN SECTION     x 2   JOINT REPLACEMENT     TRIGGER FINGER RELEASE Right 10/18/2018   Procedure: RELEASE TRIGGER FINGER/A-1 PULLEY;  Surgeon: Daryll Brod, MD;  Location: [place];  Service: Orthopedics;  Laterality: Right;  FAB   Family History  Problem Relation Age of Onset   Hyperlipidemia Mother  Stroke Mother    Colon cancer Mother    Hypertension Father    Diabetes Maternal Grandfather    Colon cancer Maternal Grandfather    Heart attack Maternal Grandfather    Heart disease Maternal Grandfather    Colon cancer Cousin        and paternal greatgrandfather   Breast cancer Paternal Aunt        apprx. 43   Social History   Socioeconomic History   Marital status: Married    Spouse name: Not on file   Number of children: 2    Years of education: Not on file   Highest education level: Not on file  Occupational History   Occupation: retired Pharmacist, hospital  Tobacco Use   Smoking status: Never   Smokeless tobacco: Never  Vaping Use   Vaping Use: Never used  Substance and Sexual Activity   Alcohol use: No   Drug use: No   Sexual activity: Not on file  Other Topics Concern   Not on file  Social History Narrative   Not on file   Social Determinants of Health   Financial Resource Strain: Not on file  Food Insecurity: Not on file  Transportation Needs: Not on file  Physical Activity: Not on file  Stress: Not on file  Social Connections: Not on file    Tobacco Counseling Counseling given: Not Answered    Diabetic?no         Activities of Daily Living In your present state of health, do you have any difficulty performing the following activities: 05/09/2021 06/11/2020  Hearing? N N  Vision? N N  Difficulty concentrating or making decisions? N N  Walking or climbing stairs? N N  Dressing or bathing? N N  Doing errands, shopping? N N  Some recent data might be hidden    Patient Care Team: Lorrene Reid, PA-C as PCP - General Mcarthur Rossetti, MD as Consulting Physician (Orthopedic Surgery) Gavin Pound, MD as Consulting Physician (Rheumatology) Jari Pigg, MD as Consulting Physician (Dermatology)  Indicate any recent Medical Services you may have received from other than [facility] providers in the past year (date may be approximate).     Assessment:   This is a routine wellness examination for [facility].  Hearing/Vision screen No results found.  Dietary issues and exercise activities discussed: -Follow low fat and carbohydrate diet. Continue with moderate physical activity.   Goals Addressed   None   Depression Screen PHQ 2/9 Scores 05/09/2021 06/11/2020 04/12/2019 01/31/2018 10/04/2017 09/27/2017 02/18/2017  PHQ - 2 Score 0 0 0 0 0 0 0  PHQ- 9 Score 0 0 0 2 0 1 -    Fall Risk Fall  Risk  05/09/2021 06/11/2020 04/12/2019 02/18/2017  Falls in the past year? 0 0 1 No  Number falls in past yr: 0 - 0 -  Injury with Fall? 0 - 1 -  Risk for fall due to : No Fall Risks No Fall Risks - -  Follow up Falls evaluation completed Falls evaluation completed - -    FALL RISK PREVENTION PERTAINING TO THE HOME:  Any stairs in or around the home? Yes  If so, are there any without handrails? No  Home free of loose throw rugs in walkways, pet beds, electrical cords, etc? No  Adequate lighting in your home to reduce risk of falls? Yes   ASSISTIVE DEVICES UTILIZED TO PREVENT FALLS:  Life alert? No  Use of a cane, walker or w/c? No  Grab bars  in the bathroom? Yes  Shower chair or bench in shower? No  Elevated toilet seat or a handicapped toilet? Yes   TIMED UP AND GO:  Was the test performed? No . Telehealth appointment  Length of time to ambulate 10 feet: 0 sec.     Cognitive Function: wnl's   6CIT Screen 05/09/2021  What Year? 0 points  What month? 0 points  What time? 0 points  Count back from 20 0 points  Months in reverse 0 points  Repeat phrase 0 points  Total Score 0    Immunizations Immunization History  Administered Date(s) Administered   Influenza-Unspecified 04/10/2016, 04/10/2017, 05/10/2018   PFIZER(Purple Top)SARS-COV-2 Vaccination 10/12/2019, 11/02/2019   Pneumococcal Conjugate-13 06/15/2020   Tdap 08/14/2015   Zoster Recombinat (Shingrix) 04/12/2019, 06/12/2019   Zoster, Live 08/11/2015    TDAP status: Up to date  Flu Vaccine status: Due, Education has been provided regarding the importance of this vaccine. Advised may receive this vaccine at local pharmacy or Health Dept. Aware to provide a copy of the vaccination record if obtained from local pharmacy or Health Dept. Verbalized acceptance and understanding.  Pneumococcal vaccine status: Up to date  Covid-19 vaccine status: Completed vaccines  Qualifies for Shingles Vaccine? Yes   Zostavax  completed Yes   Shingrix Completed?: Yes  Screening Tests Health Maintenance  Topic Date Due   DEXA SCAN  09/07/2018   COVID-19 Vaccine (3 - Pfizer risk series) 11/30/2019   INFLUENZA VACCINE  03/10/2021   MAMMOGRAM  05/09/2022   COLONOSCOPY (Pts 45-31yrs Insurance coverage will need to be confirmed)  05/28/2023   TETANUS/TDAP  08/13/2025   Hepatitis C Screening  Completed   Zoster Vaccines- Shingrix  Completed   HPV VACCINES  Aged Out    Health Maintenance  Health Maintenance Due  Topic Date Due   DEXA SCAN  09/07/2018   COVID-19 Vaccine (3 - Pfizer risk series) 11/30/2019   INFLUENZA VACCINE  03/10/2021    Colorectal cancer screening: Type of screening: Colonoscopy. Completed 05/27/2018. Repeat every 5 years  Mammogram scheduled for 05/19/2021.  Bone Density status: Ordered 05/09/2021. Pt provided with contact info and advised to call to schedule appt.  Lung Cancer Screening: (Low Dose CT Chest recommended if Age 67-80 years, 30 pack-year currently smoking OR have quit w/in 15years.) does not qualify.   Lung Cancer Screening Referral: n/a  Additional Screening:  Hepatitis C Screening: does qualify; Completed 08/20/2016  Vision Screening: Recommended annual ophthalmology exams for early detection of glaucoma and other disorders of the eye. Is the patient up to date with their annual eye exam?  Yes  Who is the provider or what is the name of the office in which the patient attends annual eye exams? [facility]  If pt is not established with a provider, would they like to be referred to a provider to establish care? No .   Dental Screening: Recommended annual dental exams for proper oral hygiene  Community Resource Referral / Chronic Care Management: CRR required this visit?  No   CCM required this visit?  No      Plan:  -Follow up as scheduled for lab visit and CPE.   I have personally reviewed and noted the following in the patient's chart:   Medical and  social history Use of alcohol, tobacco or illicit drugs  Current medications and supplements including opioid prescriptions.  Functional ability and status Nutritional status Physical activity Advanced directives List of other physicians Hospitalizations, surgeries, and ER  visits in previous 12 months Vitals Screenings to include cognitive, depression, and falls Referrals and appointments  In addition, I have reviewed and discussed with patient certain preventive protocols, quality metrics, and best practice recommendations. A written personalized care plan for preventive services as well as general preventive health recommendations were provided to patient.     Lorrene Reid, PA-C   05/09/2021

## 2021-05-09 NOTE — Patient Instructions (Signed)
Preventive Care 67 Years and Older, Female Preventive care refers to lifestyle choices and visits with your health care provider that can promote health and wellness. This includes: A yearly physical exam. This is also called an annual wellness visit. Regular dental and eye exams. Immunizations. Screening for certain conditions. Healthy lifestyle choices, such as: Eating a healthy diet. Getting regular exercise. Not using drugs or products that contain nicotine and tobacco. Limiting alcohol use. What can I expect for my preventive care visit? Physical exam Your health care provider will check your: Height and weight. These may be used to calculate your BMI (body mass index). BMI is a measurement that tells if you are at a healthy weight. Heart rate and blood pressure. Body temperature. Skin for abnormal spots. Counseling Your health care provider may ask you questions about your: Past medical problems. Family's medical history. Alcohol, tobacco, and drug use. Emotional well-being. Home life and relationship well-being. Sexual activity. Diet, exercise, and sleep habits. History of falls. Memory and ability to understand (cognition). Work and work Statistician. Pregnancy and menstrual history. Access to firearms. What immunizations do I need? Vaccines are usually given at various ages, according to a schedule. Your health care provider will recommend vaccines for you based on your age, medical history, and lifestyle or other factors, such as travel or where you work. What tests do I need? Blood tests Lipid and cholesterol levels. These may be checked every 5 years, or more often depending on your overall health. Hepatitis C test. Hepatitis B test. Screening Lung cancer screening. You may have this screening every year starting at age 67 if you have a 30-pack-year history of smoking and currently smoke or have quit within the past 15 years. Colorectal cancer screening. All  adults should have this screening starting at age 67 and continuing until age 9. Your health care provider may recommend screening at age 31 if you are at increased risk. You will have tests every 1-10 years, depending on your results and the type of screening test. Diabetes screening. This is done by checking your blood sugar (glucose) after you have not eaten for a while (fasting). You may have this done every 1-3 years. Mammogram. This may be done every 1-2 years. Talk with your health care provider about how often you should have regular mammograms. Abdominal aortic aneurysm (AAA) screening. You may need this if you are a current or former smoker. BRCA-related cancer screening. This may be done if you have a family history of breast, ovarian, tubal, or peritoneal cancers. Other tests STD (sexually transmitted disease) testing, if you are at risk. Bone density scan. This is done to screen for osteoporosis. You may have this done starting at age 67. Talk with your health care provider about your test results, treatment options, and if necessary, the need for more tests. Follow these instructions at home: Eating and drinking  Eat a diet that includes fresh fruits and vegetables, whole grains, lean protein, and low-fat dairy products. Limit your intake of foods with high amounts of sugar, saturated fats, and salt. Take vitamin and mineral supplements as recommended by your health care provider. Do not drink alcohol if your health care provider tells you not to drink. If you drink alcohol: Limit how much you have to 0-1 drink a day. Be aware of how much alcohol is in your drink. In the [place] one drink equals one 12 oz bottle of beer (355 mL), one 5 oz glass of wine (148 mL), or one  1 oz glass of hard liquor (44 mL). Lifestyle Take daily care of your teeth and gums. Brush your teeth every morning and night with fluoride toothpaste. Floss one time each day. Stay active. Exercise for at least  30 minutes 5 or more days each week. Do not use any products that contain nicotine or tobacco, such as cigarettes, e-cigarettes, and chewing tobacco. If you need help quitting, ask your health care provider. Do not use drugs. If you are sexually active, practice safe sex. Use a condom or other form of protection in order to prevent STIs (sexually transmitted infections). Talk with your health care provider about taking a low-dose aspirin or statin. Find healthy ways to cope with stress, such as: Meditation, yoga, or listening to music. Journaling. Talking to a trusted person. Spending time with friends and family. Safety Always wear your seat belt while driving or riding in a vehicle. Do not drive: If you have been drinking alcohol. Do not ride with someone who has been drinking. When you are tired or distracted. While texting. Wear a helmet and other protective equipment during sports activities. If you have firearms in your house, make sure you follow all gun safety procedures. What's next? Visit your health care provider once a year for an annual wellness visit. Ask your health care provider how often you should have your eyes and teeth checked. Stay up to date on all vaccines. This information is not intended to replace advice given to you by your health care provider. Make sure you discuss any questions you have with your health care provider. Document Revised: 10/04/2020 Document Reviewed: 07/21/2018 Elsevier Patient Education  2022 [employer].

## 2021-05-19 ENCOUNTER — Ambulatory Visit
Admission: RE | Admit: 2021-05-19 | Discharge: 2021-05-19 | Disposition: A | Discharge status: Home / Self Care | Payer: Medicare PPO | Source: Ambulatory Visit

## 2021-05-19 ENCOUNTER — Other Ambulatory Visit: Payer: Self-pay

## 2021-05-19 DIAGNOSIS — Z1231 Encounter for screening mammogram for malignant neoplasm of breast: Secondary | ICD-10-CM | POA: Diagnosis not present

## 2021-05-20 DIAGNOSIS — M19012 Primary osteoarthritis, left shoulder: Secondary | ICD-10-CM | POA: Diagnosis not present

## 2021-05-20 DIAGNOSIS — M25512 Pain in left shoulder: Secondary | ICD-10-CM | POA: Diagnosis not present

## 2021-05-30 DIAGNOSIS — M25512 Pain in left shoulder: Secondary | ICD-10-CM | POA: Diagnosis not present

## 2021-05-30 DIAGNOSIS — M19012 Primary osteoarthritis, left shoulder: Secondary | ICD-10-CM | POA: Diagnosis not present

## 2021-06-09 ENCOUNTER — Other Ambulatory Visit: Payer: Medicare PPO

## 2021-06-09 ENCOUNTER — Other Ambulatory Visit: Payer: Self-pay

## 2021-06-09 DIAGNOSIS — E7800 Pure hypercholesterolemia, unspecified: Secondary | ICD-10-CM

## 2021-06-09 DIAGNOSIS — Z0000 Encounter for general adult medical examination without abnormal findings: Secondary | ICD-10-CM | POA: Diagnosis not present

## 2021-06-09 NOTE — Addendum Note (Signed)
Addended by: Vivia Birmingham on: 06/09/2021 08:06 AM   Modules accepted: Orders

## 2021-06-10 LAB — COMPREHENSIVE METABOLIC PANEL
ALT: 14 IU/L (ref 0–32)
AST: 17 IU/L (ref 0–40)
Albumin/Globulin Ratio: 1.6 (ref 1.2–2.2)
Albumin: 4.3 g/dL (ref 3.8–4.8)
Alkaline Phosphatase: 68 IU/L (ref 44–121)
BUN/Creatinine Ratio: 18 (ref 12–28)
BUN: 16 mg/dL (ref 8–27)
Bilirubin Total: 0.4 mg/dL (ref 0.0–1.2)
CO2: 27 mmol/L (ref 20–29)
Calcium: 9.2 mg/dL (ref 8.7–10.3)
Chloride: 102 mmol/L (ref 96–106)
Creatinine, Ser: 0.88 mg/dL (ref 0.57–1.00)
Globulin, Total: 2.7 g/dL (ref 1.5–4.5)
Glucose: 88 mg/dL (ref 70–99)
Potassium: 4.2 mmol/L (ref 3.5–5.2)
Sodium: 138 mmol/L (ref 134–144)
Total Protein: 7 g/dL (ref 6.0–8.5)
eGFR: 72 mL/min/{1.73_m2} (ref >59)

## 2021-06-10 LAB — CBC WITH DIFFERENTIAL/PLATELET
Basophils Absolute: 0.1 10*3/uL (ref 0.0–0.2)
Basos: 1 %
EOS (ABSOLUTE): 0.1 10*3/uL (ref 0.0–0.4)
Eos: 2 %
Hematocrit: 38.6 % (ref 34.0–46.6)
Hemoglobin: 12.9 g/dL (ref 11.1–15.9)
Immature Grans (Abs): 0 10*3/uL (ref 0.0–0.1)
Immature Granulocytes: 0 %
Lymphocytes Absolute: 2.1 10*3/uL (ref 0.7–3.1)
Lymphs: 29 %
MCH: 30.9 pg (ref 26.6–33.0)
MCHC: 33.4 g/dL (ref 31.5–35.7)
MCV: 92 fL (ref 79–97)
Monocytes Absolute: 0.7 10*3/uL (ref 0.1–0.9)
Monocytes: 10 %
Neutrophils Absolute: 4.3 10*3/uL (ref 1.4–7.0)
Neutrophils: 58 %
Platelets: 190 10*3/uL (ref 150–450)
RBC: 4.18 x10E6/uL (ref 3.77–5.28)
RDW: 12.5 % (ref 11.7–15.4)
WBC: 7.3 10*3/uL (ref 3.4–10.8)

## 2021-06-10 LAB — LIPID PANEL
Chol/HDL Ratio: 3.6 ratio (ref 0.0–4.4)
Cholesterol, Total: 224 mg/dL — ABNORMAL HIGH (ref 100–199)
HDL: 63 mg/dL (ref >39)
LDL Chol Calc (NIH): 142 mg/dL — ABNORMAL HIGH (ref 0–99)
Triglycerides: 106 mg/dL (ref 0–149)
VLDL Cholesterol Cal: 19 mg/dL (ref 5–40)

## 2021-06-10 LAB — TSH: TSH: 3.05 u[IU]/mL (ref 0.450–4.500)

## 2021-06-10 LAB — HEMOGLOBIN A1C
Est. average glucose Bld gHb Est-mCnc: 128 mg/dL
Hgb A1c MFr Bld: 6.1 % — ABNORMAL HIGH (ref 4.8–5.6)

## 2021-06-11 ENCOUNTER — Ambulatory Visit (INDEPENDENT_AMBULATORY_CARE_PROVIDER_SITE_OTHER): Payer: Medicare PPO | Admitting: Physician Assistant

## 2021-06-11 ENCOUNTER — Other Ambulatory Visit: Payer: Self-pay

## 2021-06-11 ENCOUNTER — Encounter: Payer: Self-pay | Admitting: Physician Assistant

## 2021-06-11 VITALS — BP 116/72 | HR 66 | Temp 97.8°F | Ht 61.0 in | Wt 135.0 lb

## 2021-06-11 DIAGNOSIS — R7302 Impaired glucose tolerance (oral): Secondary | ICD-10-CM

## 2021-06-11 DIAGNOSIS — Z23 Encounter for immunization: Secondary | ICD-10-CM | POA: Diagnosis not present

## 2021-06-11 DIAGNOSIS — Z0000 Encounter for general adult medical examination without abnormal findings: Secondary | ICD-10-CM | POA: Diagnosis not present

## 2021-06-11 DIAGNOSIS — E7800 Pure hypercholesterolemia, unspecified: Secondary | ICD-10-CM

## 2021-06-11 NOTE — Patient Instructions (Signed)
Preventive Care 67 Years and Older, Female Preventive care refers to lifestyle choices and visits with your health care provider that can promote health and wellness. This includes: A yearly physical exam. This is also called an annual wellness visit. Regular dental and eye exams. Immunizations. Screening for certain conditions. Healthy lifestyle choices, such as: Eating a healthy diet. Getting regular exercise. Not using drugs or products that contain nicotine and tobacco. Limiting alcohol use. What can I expect for my preventive care visit? Physical exam Your health care provider will check your: Height and weight. These may be used to calculate your BMI (body mass index). BMI is a measurement that tells if you are at a healthy weight. Heart rate and blood pressure. Body temperature. Skin for abnormal spots. Counseling Your health care provider may ask you questions about your: Past medical problems. Family's medical history. Alcohol, tobacco, and drug use. Emotional well-being. Home life and relationship well-being. Sexual activity. Diet, exercise, and sleep habits. History of falls. Memory and ability to understand (cognition). Work and work Statistician. Pregnancy and menstrual history. Access to firearms. What immunizations do I need? Vaccines are usually given at various ages, according to a schedule. Your health care provider will recommend vaccines for you based on your age, medical history, and lifestyle or other factors, such as travel or where you work. What tests do I need? Blood tests Lipid and cholesterol levels. These may be checked every 5 years, or more often depending on your overall health. Hepatitis C test. Hepatitis B test. Screening Lung cancer screening. You may have this screening every year starting at age 67 if you have a 30-pack-year history of smoking and currently smoke or have quit within the past 15 years. Colorectal cancer screening. All  adults should have this screening starting at age 67 and continuing until age 9. Your health care provider may recommend screening at age 67 if you are at increased risk. You will have tests every 1-10 years, depending on your results and the type of screening test. Diabetes screening. This is done by checking your blood sugar (glucose) after you have not eaten for a while (fasting). You may have this done every 1-3 years. Mammogram. This may be done every 1-2 years. Talk with your health care provider about how often you should have regular mammograms. Abdominal aortic aneurysm (AAA) screening. You may need this if you are a current or former smoker. BRCA-related cancer screening. This may be done if you have a family history of breast, ovarian, tubal, or peritoneal cancers. Other tests STD (sexually transmitted disease) testing, if you are at risk. Bone density scan. This is done to screen for osteoporosis. You may have this done starting at age 67. Talk with your health care provider about your test results, treatment options, and if necessary, the need for more tests. Follow these instructions at home: Eating and drinking  Eat a diet that includes fresh fruits and vegetables, whole grains, lean protein, and low-fat dairy products. Limit your intake of foods with high amounts of sugar, saturated fats, and salt. Take vitamin and mineral supplements as recommended by your health care provider. Do not drink alcohol if your health care provider tells you not to drink. If you drink alcohol: Limit how much you have to 0-1 drink a day. Be aware of how much alcohol is in your drink. In the [place] one drink equals one 12 oz bottle of beer (355 mL), one 5 oz glass of wine (148 mL), or one  1 oz glass of hard liquor (44 mL). Lifestyle Take daily care of your teeth and gums. Brush your teeth every morning and night with fluoride toothpaste. Floss one time each day. Stay active. Exercise for at least  30 minutes 5 or more days each week. Do not use any products that contain nicotine or tobacco, such as cigarettes, e-cigarettes, and chewing tobacco. If you need help quitting, ask your health care provider. Do not use drugs. If you are sexually active, practice safe sex. Use a condom or other form of protection in order to prevent STIs (sexually transmitted infections). Talk with your health care provider about taking a low-dose aspirin or statin. Find healthy ways to cope with stress, such as: Meditation, yoga, or listening to music. Journaling. Talking to a trusted person. Spending time with friends and family. Safety Always wear your seat belt while driving or riding in a vehicle. Do not drive: If you have been drinking alcohol. Do not ride with someone who has been drinking. When you are tired or distracted. While texting. Wear a helmet and other protective equipment during sports activities. If you have firearms in your house, make sure you follow all gun safety procedures. What's next? Visit your health care provider once a year for an annual wellness visit. Ask your health care provider how often you should have your eyes and teeth checked. Stay up to date on all vaccines. This information is not intended to replace advice given to you by your health care provider. Make sure you discuss any questions you have with your health care provider. Document Revised: 10/04/2020 Document Reviewed: 07/21/2018 Elsevier Patient Education  2022 [employer].

## 2021-06-11 NOTE — Progress Notes (Signed)
Subjective:     Jordan Wade is a 65 y.o. female and is here for a comprehensive physical exam. The patient reports no problems.    Social History   Socioeconomic History   Marital status: Married    Spouse name: Not on file   Number of children: 2   Years of education: Not on file   Highest education level: Not on file  Occupational History   Occupation: retired Pharmacist, hospital  Tobacco Use   Smoking status: Never   Smokeless tobacco: Never  Vaping Use   Vaping Use: Never used  Substance and Sexual Activity   Alcohol use: No   Drug use: No   Sexual activity: Not on file  Other Topics Concern   Not on file  Social History Narrative   Not on file   Social Determinants of Health   Financial Resource Strain: Not on file  Food Insecurity: Not on file  Transportation Needs: Not on file  Physical Activity: Not on file  Stress: Not on file  Social Connections: Not on file  Intimate Partner Violence: Not on file   Health Maintenance  Topic Date Due   DEXA SCAN  09/07/2018   COVID-19 Vaccine (3 - Pfizer risk series) 11/30/2019   Pneumonia Vaccine 92+ Years old (2 - PPSV23 if available, else PCV20) 06/15/2021   MAMMOGRAM  05/20/2023   COLONOSCOPY (Pts 45-82yrs Insurance coverage will need to be confirmed)  05/28/2023   TETANUS/TDAP  08/13/2025   INFLUENZA VACCINE  Completed   Hepatitis C Screening  Completed   Zoster Vaccines- Shingrix  Completed   HPV VACCINES  Aged Out    The following portions of the patient's history were reviewed and updated as appropriate: allergies, current medications, past family history, past medical history, past social history, past surgical history, and problem list.  Review of Systems Pertinent items noted in HPI and remainder of comprehensive ROS otherwise negative.   Objective:    BP 116/72   Pulse 66   Temp 97.8 F (36.6 C)   Ht 5\' 1"  (1.549 m)   Wt 135 lb (61.2 kg)   SpO2 100%   BMI 25.51 kg/m  General appearance: alert,  cooperative, and no distress Head: Normocephalic, without obvious abnormality, atraumatic Eyes: conjunctivae/corneas clear. PERRL, EOM's intact. Fundi benign. Ears:  cerumen impaction of both ears Nose: Nares normal. Septum midline. Mucosa normal. No drainage or sinus tenderness. Throat: lips, mucosa, and tongue normal; teeth and gums normal Neck: no adenopathy, no JVD, supple, symmetrical, trachea midline, and thyroid: normal to inspection and palpation Back: symmetric, no curvature. ROM normal. No CVA tenderness. Lungs: clear to auscultation bilaterally Heart: regular rate and rhythm and S1, S2 normal Abdomen: soft, non-tender; bowel sounds normal; no masses,  no organomegaly Pelvic: not indicated; post-menopausal, no abnormal Pap smears in past Extremities: extremities normal, atraumatic, no cyanosis or edema Pulses: 2+ and symmetric Skin: Skin color, texture, turgor normal. No rashes or lesions Lymph nodes: Cervical adenopathy: normal and Supraclavicular adenopathy: normal Neurologic: Grossly normal    Assessment:    Healthy female exam.     Plan:  -Discussed most recent lab results which are essentially within normal limits or stable from prior with exception of mildly increased LDL and A1c likely secondary to eating out more and increased personal stress. UTD mammogram, colonoscopy, Tdap, scheduled for bone density 10/09/21, and agreeable to influenza vaccine. -Discussed low fat diet. -Continue to stay well hydrated and as active as possible. -Follow up in 6 months  for elevated LDL cholesterol, glucose intolerance and FBW (lipid panel, cmp, a1c) few days prior    See After Visit Summary for Counseling Recommendations

## 2021-07-30 ENCOUNTER — Telehealth (INDEPENDENT_AMBULATORY_CARE_PROVIDER_SITE_OTHER): Payer: Medicare PPO | Admitting: Nurse Practitioner

## 2021-07-30 ENCOUNTER — Encounter: Payer: Self-pay | Admitting: Nurse Practitioner

## 2021-07-30 ENCOUNTER — Other Ambulatory Visit: Payer: Self-pay

## 2021-07-30 VITALS — Temp 97.0°F | Ht 61.0 in | Wt 135.0 lb

## 2021-07-30 DIAGNOSIS — J0140 Acute pansinusitis, unspecified: Secondary | ICD-10-CM | POA: Diagnosis not present

## 2021-07-30 MED ORDER — AZITHROMYCIN 250 MG PO TABS: ORAL_TABLET | ORAL | 0 refills | Status: DC

## 2021-07-30 NOTE — Progress Notes (Signed)
Virtual Visit via Telephone Note  I connected with Jordan Wade on 07/30/21 at 10:10 AM EST by telephone and verified that I am speaking with the correct person using two identifiers.  Location: Patient: home Provider: home office    I discussed the limitations, risks, security and privacy concerns of performing an evaluation and management service by telephone and the availability of in person appointments. I also discussed with the patient that there may be a patient responsible charge related to this service. The patient expressed understanding and agreed to proceed.   History of Present Illness: The patient states that she is having nasal congestion, headache, had scratchy throat, though that has improved. She denies nausea or vomiting. She denies fever, chills, or body aches. She states that mucus production is yellow green in nature. She states that symptoms are worse first thing in the morning and in the evening.  She is able to take mucinex DM which helps symptoms. She is concerned about being around family members and her husband, who does have other health issues going on, and giving them infection. She has not tested for COVID 19. She has not been exposed to anyone with flu or COVID 19 to her knowledge. She states that her symptoms started Monday and are gradually worsening.    Observations/Objective:  The patient is alert and oriented. She is pleasant and answers all questions appropriately. Breathing is non-labored. She is in no acute distress at this time.  She is nasally congested.   Today's Vitals   07/30/21 1022  Temp: (!) 97 F (36.1 C)  Weight: 135 lb (61.2 kg)  Height: 5\' 1"  (1.549 m)   Body mass index is 25.51 kg/m.   Assessment and Plan: 1. Acute non-recurrent pansinusitis Start z-pack. Take as directed for 5 days. Rest and increase fluids. Continue using OTC medication to control symptoms. Recommend she test for COVID 19 so that proper treatment can be started  as soon as possible if positive.  - azithromycin (ZITHROMAX) 250 MG tablet; z-pack - take as directed for 5 days  Dispense: 6 tablet; Refill: 0   Follow Up Instructions:    I discussed the assessment and treatment plan with the patient. The patient was provided an opportunity to ask questions and all were answered. The patient agreed with the plan and demonstrated an understanding of the instructions.   The patient was advised to call back or seek an in-person evaluation if the symptoms worsen or if the condition fails to improve as anticipated.  I provided 10 minutes of non-face-to-face time during this encounter.   Ronnell Freshwater, NP

## 2021-08-22 DIAGNOSIS — M180 Bilateral primary osteoarthritis of first carpometacarpal joints: Secondary | ICD-10-CM | POA: Diagnosis not present

## 2021-09-10 DIAGNOSIS — L820 Inflamed seborrheic keratosis: Secondary | ICD-10-CM | POA: Diagnosis not present

## 2021-09-10 DIAGNOSIS — Z808 Family history of malignant neoplasm of other organs or systems: Secondary | ICD-10-CM | POA: Diagnosis not present

## 2021-09-10 DIAGNOSIS — Z86018 Personal history of other benign neoplasm: Secondary | ICD-10-CM | POA: Diagnosis not present

## 2021-09-10 DIAGNOSIS — Z85820 Personal history of malignant melanoma of skin: Secondary | ICD-10-CM | POA: Diagnosis not present

## 2021-09-10 DIAGNOSIS — L578 Other skin changes due to chronic exposure to nonionizing radiation: Secondary | ICD-10-CM | POA: Diagnosis not present

## 2021-09-10 DIAGNOSIS — C44722 Squamous cell carcinoma of skin of right lower limb, including hip: Secondary | ICD-10-CM | POA: Diagnosis not present

## 2021-09-10 DIAGNOSIS — D225 Melanocytic nevi of trunk: Secondary | ICD-10-CM | POA: Diagnosis not present

## 2021-09-10 DIAGNOSIS — Z23 Encounter for immunization: Secondary | ICD-10-CM | POA: Diagnosis not present

## 2021-09-10 DIAGNOSIS — D2261 Melanocytic nevi of right upper limb, including shoulder: Secondary | ICD-10-CM | POA: Diagnosis not present

## 2021-09-10 DIAGNOSIS — L821 Other seborrheic keratosis: Secondary | ICD-10-CM | POA: Diagnosis not present

## 2021-09-10 DIAGNOSIS — D2271 Melanocytic nevi of right lower limb, including hip: Secondary | ICD-10-CM | POA: Diagnosis not present

## 2021-09-18 DIAGNOSIS — H2513 Age-related nuclear cataract, bilateral: Secondary | ICD-10-CM | POA: Diagnosis not present

## 2021-09-18 DIAGNOSIS — H16223 Keratoconjunctivitis sicca, not specified as Sjogren's, bilateral: Secondary | ICD-10-CM | POA: Diagnosis not present

## 2021-09-18 DIAGNOSIS — H524 Presbyopia: Secondary | ICD-10-CM | POA: Diagnosis not present

## 2021-10-09 ENCOUNTER — Other Ambulatory Visit: Payer: Medicare PPO

## 2021-11-11 DIAGNOSIS — D0471 Carcinoma in situ of skin of right lower limb, including hip: Secondary | ICD-10-CM | POA: Diagnosis not present

## 2021-11-11 DIAGNOSIS — C44722 Squamous cell carcinoma of skin of right lower limb, including hip: Secondary | ICD-10-CM | POA: Diagnosis not present

## 2021-12-16 DIAGNOSIS — M19012 Primary osteoarthritis, left shoulder: Secondary | ICD-10-CM | POA: Diagnosis not present

## 2021-12-16 DIAGNOSIS — M25512 Pain in left shoulder: Secondary | ICD-10-CM | POA: Diagnosis not present

## 2022-01-02 ENCOUNTER — Ambulatory Visit
Admission: RE | Admit: 2022-01-02 | Discharge: 2022-01-02 | Disposition: A | Discharge status: Home / Self Care | Payer: Medicare PPO | Source: Ambulatory Visit | Attending: Physician Assistant | Admitting: Physician Assistant

## 2022-01-02 DIAGNOSIS — Z780 Asymptomatic menopausal state: Secondary | ICD-10-CM

## 2022-01-02 DIAGNOSIS — Z13820 Encounter for screening for osteoporosis: Secondary | ICD-10-CM

## 2022-01-02 DIAGNOSIS — M85832 Other specified disorders of bone density and structure, left forearm: Secondary | ICD-10-CM | POA: Diagnosis not present

## 2022-01-20 DIAGNOSIS — M19012 Primary osteoarthritis, left shoulder: Secondary | ICD-10-CM | POA: Diagnosis not present

## 2022-01-26 IMAGING — MG MM DIGITAL SCREENING BILAT W/ TOMO AND CAD
8 series · 8 of 24 positions shown · non-contrast
Comparison: Previous exam(s).

CLINICAL DATA: Screening.

EXAM:
DIGITAL SCREENING BILATERAL MAMMOGRAM WITH TOMOSYNTHESIS AND CAD
TECHNIQUE: Bilateral screening digital craniocaudal and mediolateral oblique
mammograms were obtained. Bilateral screening digital breast
tomosynthesis was performed. The images were evaluated with
computer-aided detection.

[R CC synth-2D]
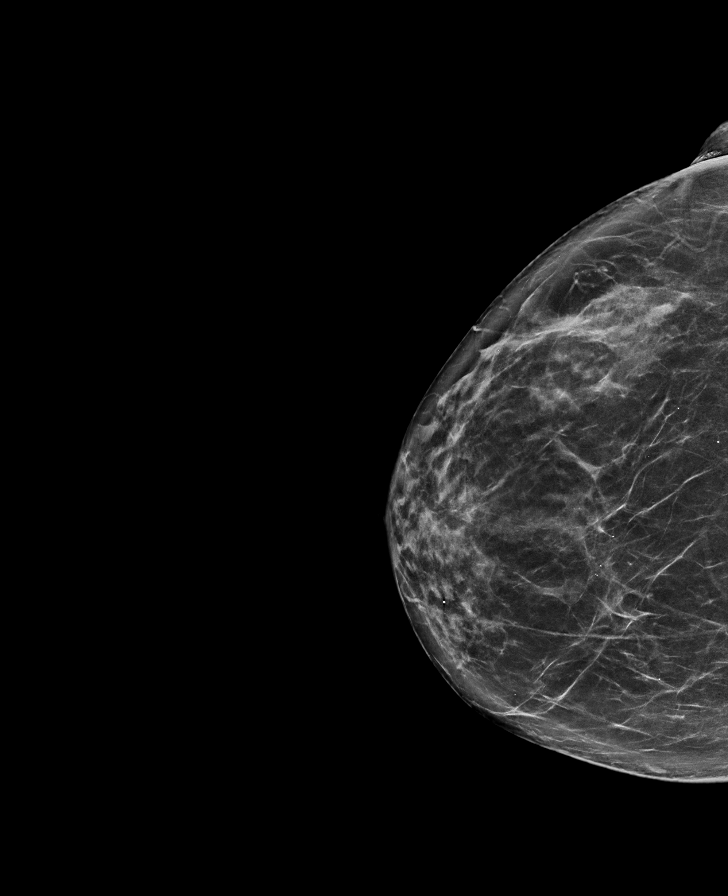

[L MLO synth-2D]
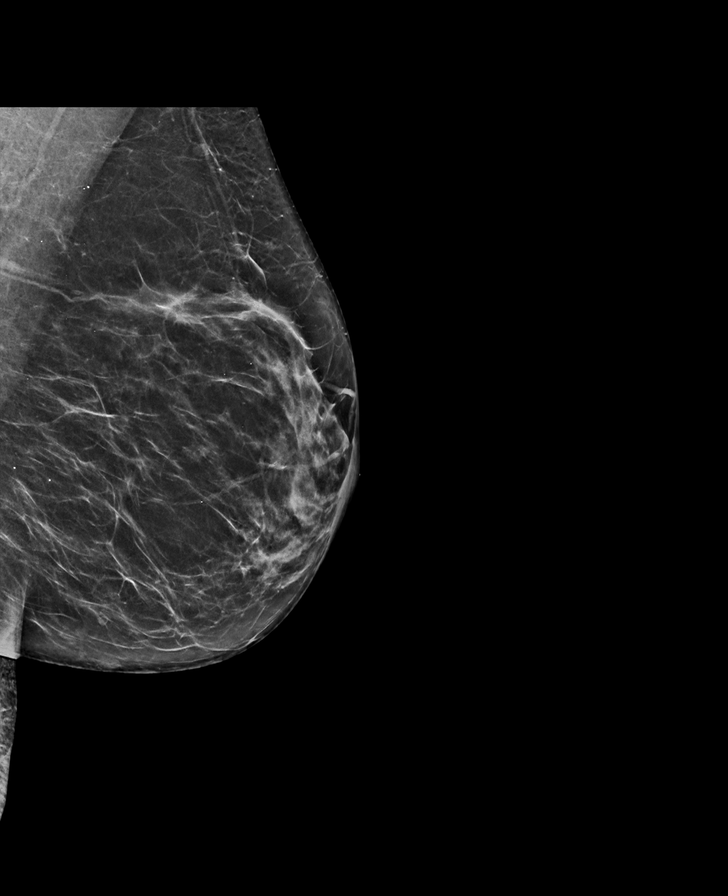

[L CC synth-2D]
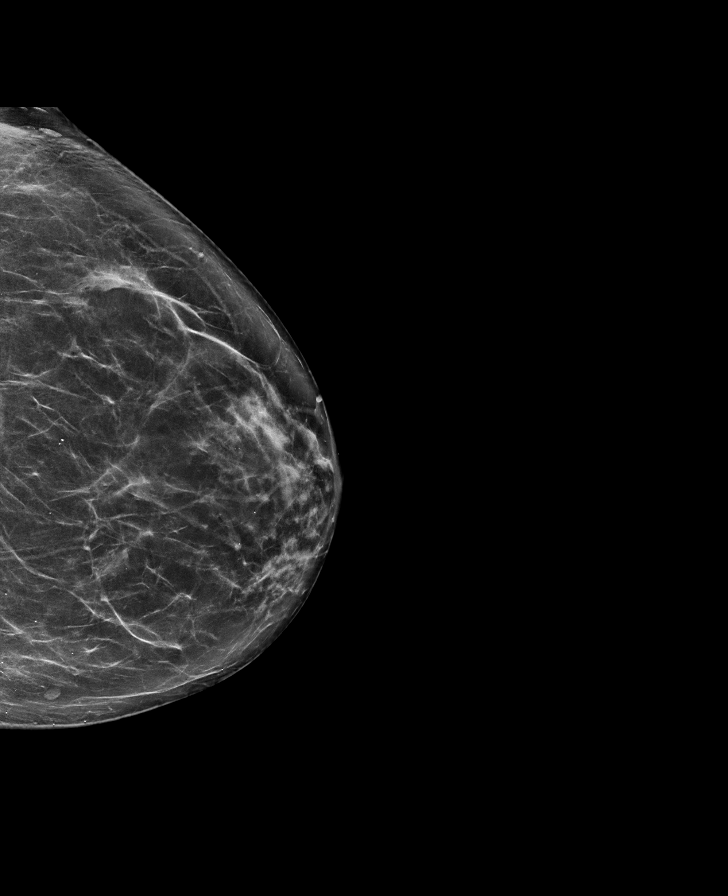

[R MLO synth-2D]
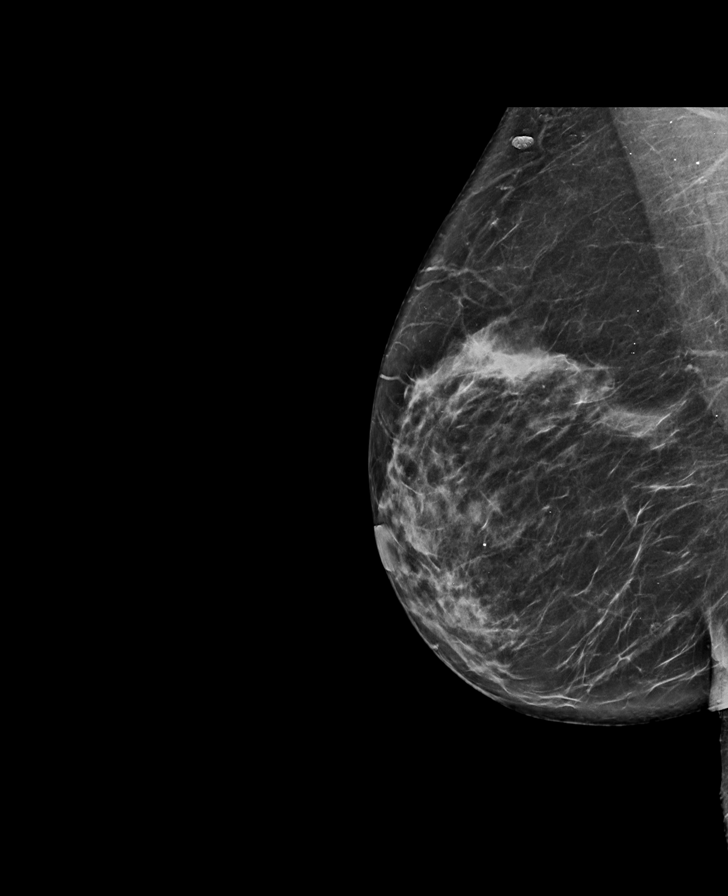

[L CC tomo · tomo slice 39/77.0]
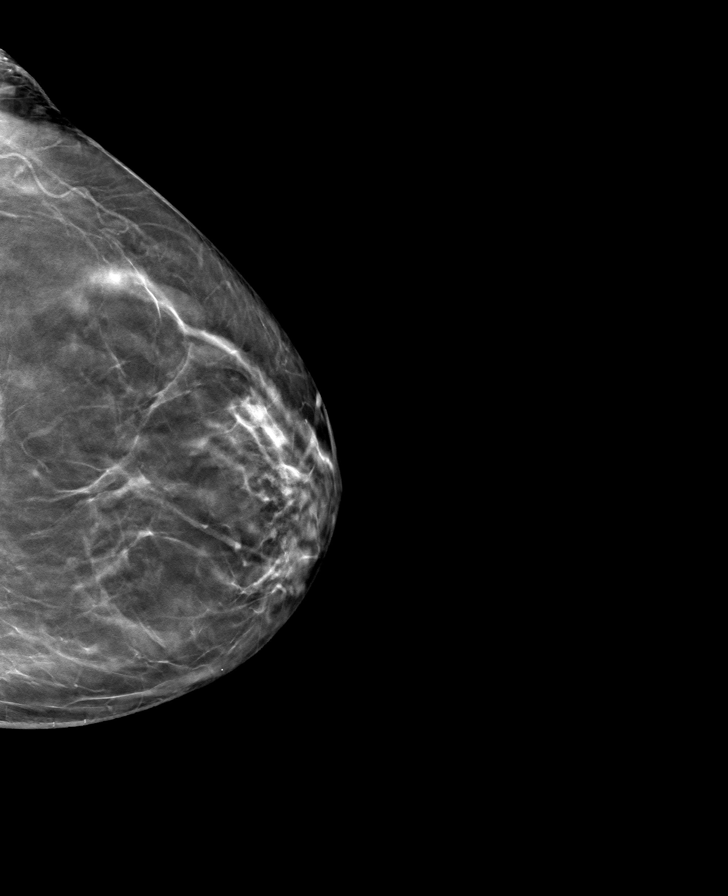

[L MLO tomo · tomo slice 37/73.0]
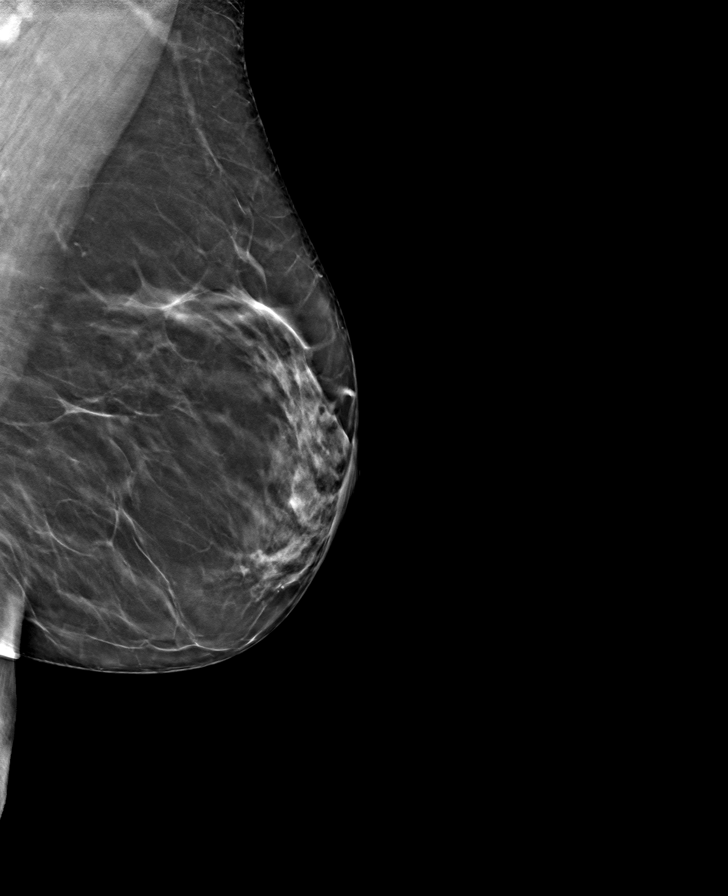

[R CC tomo · tomo slice 37/73.0]
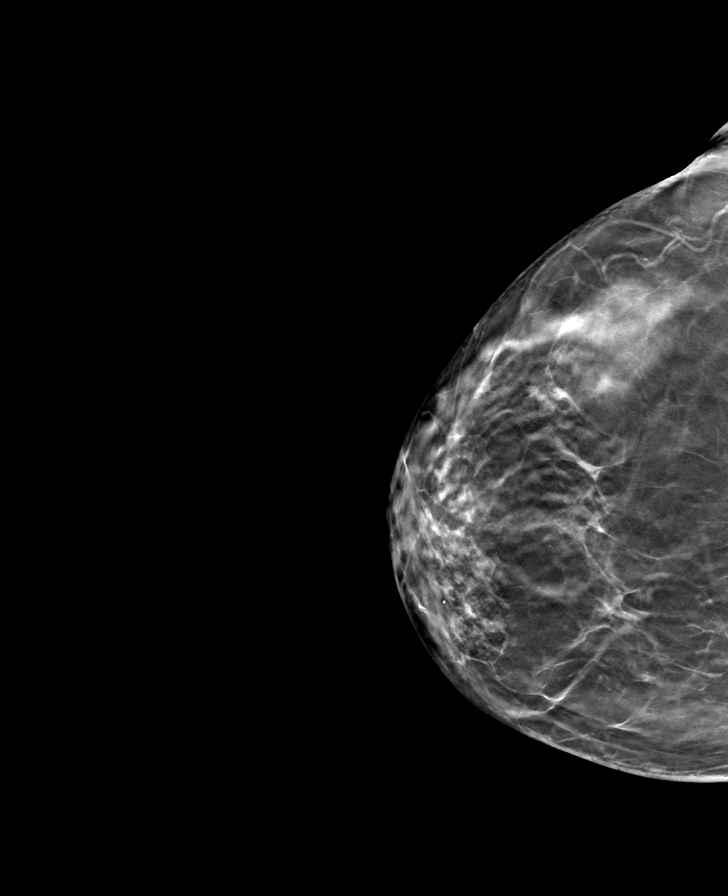

[R MLO tomo · tomo slice 39/77.0]
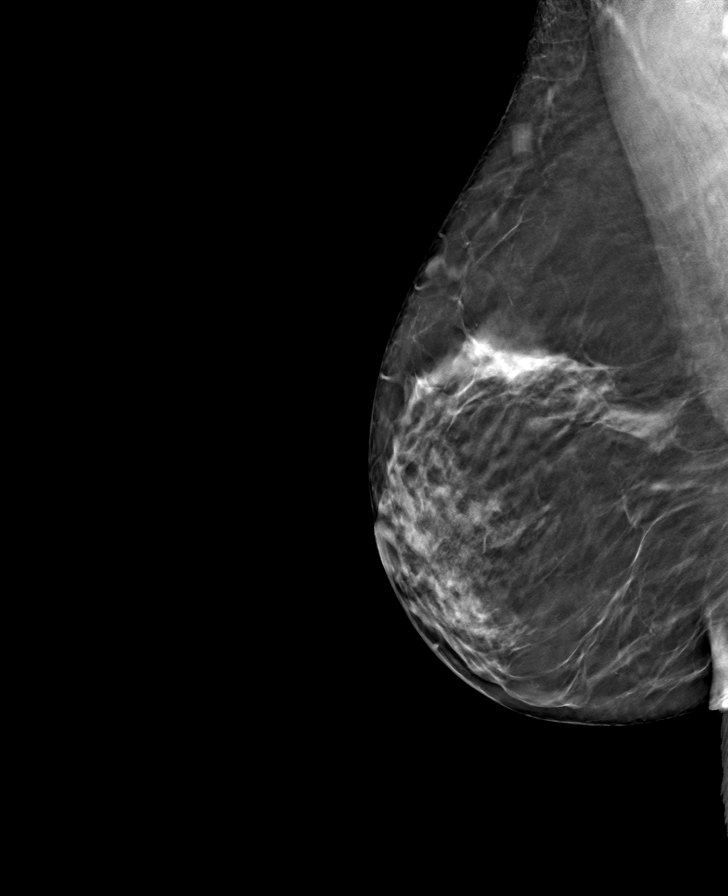

[8 of 24 positions shown; findings below may reference images not displayed]

ACR Breast Density Category b: There are scattered areas of
fibroglandular density.
FINDINGS: There are no findings suspicious for malignancy.
IMPRESSION: No mammographic evidence of malignancy. A result letter of this
screening mammogram will be mailed directly to the patient.

RECOMMENDATION:
Screening mammogram in one year. (Code:[id number]-O-LD2)

BI-RADS CATEGORY  1: Negative.

## 2022-03-04 DIAGNOSIS — Z808 Family history of malignant neoplasm of other organs or systems: Secondary | ICD-10-CM | POA: Diagnosis not present

## 2022-03-04 DIAGNOSIS — C44619 Basal cell carcinoma of skin of left upper limb, including shoulder: Secondary | ICD-10-CM | POA: Diagnosis not present

## 2022-03-04 DIAGNOSIS — D225 Melanocytic nevi of trunk: Secondary | ICD-10-CM | POA: Diagnosis not present

## 2022-03-04 DIAGNOSIS — L578 Other skin changes due to chronic exposure to nonionizing radiation: Secondary | ICD-10-CM | POA: Diagnosis not present

## 2022-03-04 DIAGNOSIS — L570 Actinic keratosis: Secondary | ICD-10-CM | POA: Diagnosis not present

## 2022-03-04 DIAGNOSIS — Z86018 Personal history of other benign neoplasm: Secondary | ICD-10-CM | POA: Diagnosis not present

## 2022-03-04 DIAGNOSIS — Z85820 Personal history of malignant melanoma of skin: Secondary | ICD-10-CM | POA: Diagnosis not present

## 2022-03-04 DIAGNOSIS — D2261 Melanocytic nevi of right upper limb, including shoulder: Secondary | ICD-10-CM | POA: Diagnosis not present

## 2022-03-04 DIAGNOSIS — L821 Other seborrheic keratosis: Secondary | ICD-10-CM | POA: Diagnosis not present

## 2022-03-04 DIAGNOSIS — D2271 Melanocytic nevi of right lower limb, including hip: Secondary | ICD-10-CM | POA: Diagnosis not present

## 2022-04-27 ENCOUNTER — Other Ambulatory Visit: Payer: Self-pay | Admitting: Physician Assistant

## 2022-04-27 DIAGNOSIS — Z1231 Encounter for screening mammogram for malignant neoplasm of breast: Secondary | ICD-10-CM

## 2022-04-28 DIAGNOSIS — C44619 Basal cell carcinoma of skin of left upper limb, including shoulder: Secondary | ICD-10-CM | POA: Diagnosis not present

## 2022-04-28 DIAGNOSIS — L988 Other specified disorders of the skin and subcutaneous tissue: Secondary | ICD-10-CM | POA: Diagnosis not present

## 2022-05-20 ENCOUNTER — Ambulatory Visit: Payer: Medicare PPO

## 2022-06-12 ENCOUNTER — Other Ambulatory Visit: Payer: Medicare PPO

## 2022-06-12 ENCOUNTER — Other Ambulatory Visit: Payer: Self-pay

## 2022-06-12 DIAGNOSIS — Z130 Encounter for screening for diseases of the blood and blood-forming organs and certain disorders involving the immune mechanism: Secondary | ICD-10-CM

## 2022-06-12 DIAGNOSIS — Z0000 Encounter for general adult medical examination without abnormal findings: Secondary | ICD-10-CM

## 2022-06-12 DIAGNOSIS — Z13228 Encounter for screening for other metabolic disorders: Secondary | ICD-10-CM | POA: Diagnosis not present

## 2022-06-12 DIAGNOSIS — E7800 Pure hypercholesterolemia, unspecified: Secondary | ICD-10-CM

## 2022-06-12 DIAGNOSIS — Z1329 Encounter for screening for other suspected endocrine disorder: Secondary | ICD-10-CM | POA: Diagnosis not present

## 2022-06-12 DIAGNOSIS — Z1321 Encounter for screening for nutritional disorder: Secondary | ICD-10-CM | POA: Diagnosis not present

## 2022-06-12 NOTE — Progress Notes (Signed)
tsh

## 2022-06-12 NOTE — Addendum Note (Signed)
Addended by: Vivia Birmingham on: 06/12/2022 08:08 AM   Modules accepted: Orders

## 2022-06-13 LAB — COMPREHENSIVE METABOLIC PANEL
ALT: 15 IU/L (ref 0–32)
AST: 19 IU/L (ref 0–40)
Albumin/Globulin Ratio: 1.7 (ref 1.2–2.2)
Albumin: 4.6 g/dL (ref 3.9–4.9)
Alkaline Phosphatase: 72 IU/L (ref 44–121)
BUN/Creatinine Ratio: 16 (ref 12–28)
BUN: 16 mg/dL (ref 8–27)
Bilirubin Total: 0.4 mg/dL (ref 0.0–1.2)
CO2: 25 mmol/L (ref 20–29)
Calcium: 9.8 mg/dL (ref 8.7–10.3)
Chloride: 103 mmol/L (ref 96–106)
Creatinine, Ser: 0.99 mg/dL (ref 0.57–1.00)
Globulin, Total: 2.7 g/dL (ref 1.5–4.5)
Glucose: 93 mg/dL (ref 70–99)
Potassium: 4.5 mmol/L (ref 3.5–5.2)
Sodium: 139 mmol/L (ref 134–144)
Total Protein: 7.3 g/dL (ref 6.0–8.5)
eGFR: 62 mL/min/{1.73_m2} (ref >59)

## 2022-06-13 LAB — CBC WITH DIFFERENTIAL/PLATELET
Basophils Absolute: 0.1 10*3/uL (ref 0.0–0.2)
Basos: 1 %
EOS (ABSOLUTE): 0.1 10*3/uL (ref 0.0–0.4)
Eos: 2 %
Hematocrit: 40.9 % (ref 34.0–46.6)
Hemoglobin: 13.6 g/dL (ref 11.1–15.9)
Immature Grans (Abs): 0 10*3/uL (ref 0.0–0.1)
Immature Granulocytes: 0 %
Lymphocytes Absolute: 2.2 10*3/uL (ref 0.7–3.1)
Lymphs: 33 %
MCH: 30.6 pg (ref 26.6–33.0)
MCHC: 33.3 g/dL (ref 31.5–35.7)
MCV: 92 fL (ref 79–97)
Monocytes Absolute: 0.6 10*3/uL (ref 0.1–0.9)
Monocytes: 10 %
Neutrophils Absolute: 3.7 10*3/uL (ref 1.4–7.0)
Neutrophils: 54 %
Platelets: 231 10*3/uL (ref 150–450)
RBC: 4.44 x10E6/uL (ref 3.77–5.28)
RDW: 12.3 % (ref 11.7–15.4)
WBC: 6.7 10*3/uL (ref 3.4–10.8)

## 2022-06-13 LAB — HEMOGLOBIN A1C
Est. average glucose Bld gHb Est-mCnc: 128 mg/dL
Hgb A1c MFr Bld: 6.1 % — ABNORMAL HIGH (ref 4.8–5.6)

## 2022-06-13 LAB — LIPID PANEL
Chol/HDL Ratio: 3.6 ratio (ref 0.0–4.4)
Cholesterol, Total: 249 mg/dL — ABNORMAL HIGH (ref 100–199)
HDL: 70 mg/dL (ref >39)
LDL Chol Calc (NIH): 161 mg/dL — ABNORMAL HIGH (ref 0–99)
Triglycerides: 103 mg/dL (ref 0–149)
VLDL Cholesterol Cal: 18 mg/dL (ref 5–40)

## 2022-06-13 LAB — TSH: TSH: 2.19 u[IU]/mL (ref 0.450–4.500)

## 2022-06-23 ENCOUNTER — Encounter: Payer: Self-pay | Admitting: Physician Assistant

## 2022-06-23 ENCOUNTER — Ambulatory Visit (INDEPENDENT_AMBULATORY_CARE_PROVIDER_SITE_OTHER): Payer: Medicare PPO | Admitting: Physician Assistant

## 2022-06-23 VITALS — BP 133/82 | HR 74 | Resp 18 | Ht 61.0 in | Wt 136.0 lb

## 2022-06-23 DIAGNOSIS — Z0000 Encounter for general adult medical examination without abnormal findings: Secondary | ICD-10-CM

## 2022-06-23 DIAGNOSIS — R7302 Impaired glucose tolerance (oral): Secondary | ICD-10-CM | POA: Diagnosis not present

## 2022-06-23 DIAGNOSIS — E7800 Pure hypercholesterolemia, unspecified: Secondary | ICD-10-CM

## 2022-06-23 NOTE — Patient Instructions (Signed)
Preventive Care 65 Years and Older, Female Preventive care refers to lifestyle choices and visits with your health care provider that can promote health and wellness. Preventive care visits are also called wellness exams. What can I expect for my preventive care visit? Counseling Your health care provider may ask you questions about your: Medical history, including: Past medical problems. Family medical history. Pregnancy and menstrual history. History of falls. Current health, including: Memory and ability to understand (cognition). Emotional well-being. Home life and relationship well-being. Sexual activity and sexual health. Lifestyle, including: Alcohol, nicotine or tobacco, and drug use. Access to firearms. Diet, exercise, and sleep habits. Work and work environment. Sunscreen use. Safety issues such as seatbelt and bike helmet use. Physical exam Your health care provider will check your: Height and weight. These may be used to calculate your BMI (body mass index). BMI is a measurement that tells if you are at a healthy weight. Waist circumference. This measures the distance around your waistline. This measurement also tells if you are at a healthy weight and may help predict your risk of certain diseases, such as type 2 diabetes and high blood pressure. Heart rate and blood pressure. Body temperature. Skin for abnormal spots. What immunizations do I need?  Vaccines are usually given at various ages, according to a schedule. Your health care provider will recommend vaccines for you based on your age, medical history, and lifestyle or other factors, such as travel or where you work. What tests do I need? Screening Your health care provider may recommend screening tests for certain conditions. This may include: Lipid and cholesterol levels. Hepatitis C test. Hepatitis B test. HIV (human immunodeficiency virus) test. STI (sexually transmitted infection) testing, if you are at  risk. Lung cancer screening. Colorectal cancer screening. Diabetes screening. This is done by checking your blood sugar (glucose) after you have not eaten for a while (fasting). Mammogram. Talk with your health care provider about how often you should have regular mammograms. BRCA-related cancer screening. This may be done if you have a family history of breast, ovarian, tubal, or peritoneal cancers. Bone density scan. This is done to screen for osteoporosis. Talk with your health care provider about your test results, treatment options, and if necessary, the need for more tests. Follow these instructions at home: Eating and drinking  Eat a diet that includes fresh fruits and vegetables, whole grains, lean protein, and low-fat dairy products. Limit your intake of foods with high amounts of sugar, saturated fats, and salt. Take vitamin and mineral supplements as recommended by your health care provider. Do not drink alcohol if your health care provider tells you not to drink. If you drink alcohol: Limit how much you have to 0-1 drink a day. Know how much alcohol is in your drink. In the [place] one drink equals one 12 oz bottle of beer (355 mL), one 5 oz glass of wine (148 mL), or one 1 oz glass of hard liquor (44 mL). Lifestyle Brush your teeth every morning and night with fluoride toothpaste. Floss one time each day. Exercise for at least 30 minutes 5 or more days each week. Do not use any products that contain nicotine or tobacco. These products include cigarettes, chewing tobacco, and vaping devices, such as e-cigarettes. If you need help quitting, ask your health care provider. Do not use drugs. If you are sexually active, practice safe sex. Use a condom or other form of protection in order to prevent STIs. Take aspirin only as told by   your health care provider. Make sure that you understand how much to take and what form to take. Work with your health care provider to find out whether it  is safe and beneficial for you to take aspirin daily. Ask your health care provider if you need to take a cholesterol-lowering medicine (statin). Find healthy ways to manage stress, such as: Meditation, yoga, or listening to music. Journaling. Talking to a trusted person. Spending time with friends and family. Minimize exposure to UV radiation to reduce your risk of skin cancer. Safety Always wear your seat belt while driving or riding in a vehicle. Do not drive: If you have been drinking alcohol. Do not ride with someone who has been drinking. When you are tired or distracted. While texting. If you have been using any mind-altering substances or drugs. Wear a helmet and other protective equipment during sports activities. If you have firearms in your house, make sure you follow all gun safety procedures. What's next? Visit your health care provider once a year for an annual wellness visit. Ask your health care provider how often you should have your eyes and teeth checked. Stay up to date on all vaccines. This information is not intended to replace advice given to you by your health care provider. Make sure you discuss any questions you have with your health care provider. Document Revised: 01/22/2021 Document Reviewed: 01/22/2021 Elsevier Patient Education  2023 [employer]  

## 2022-06-23 NOTE — Progress Notes (Signed)
Complete physical exam   Patient: Jordan Wade   DOB: 06/27/1956   66 y.o. Female  MRN: [id number] Visit Date: 06/23/2022   Chief Complaint  Patient presents with   Annual Exam   Subjective    Jordan Wade is a 66 y.o. female who presents today for a complete physical exam.  She reports consuming a low fat and low carbohydrate  diet.  Cardio and light weights 5-6 times per week.  She generally feels fairly well. She does not have additional problems to discuss today.     Past Medical History:  Diagnosis Date   Arthritis    hands   Carpal tunnel syndrome of right wrist    PONV (postoperative nausea and vomiting)    Trigger thumb of right hand    Past Surgical History:  Procedure Laterality Date   BILATERAL ANTERIOR TOTAL HIP ARTHROPLASTY Bilateral 11/04/2012   Procedure: BILATERAL ANTERIOR TOTAL HIP ARTHROPLASTY;  Surgeon: Mcarthur Rossetti, MD;  Location: [facility];  Service: Orthopedics;  Laterality: Bilateral;   CARPAL TUNNEL RELEASE Right 10/18/2018   Procedure: CARPAL TUNNEL RELEASE;  Surgeon: Daryll Brod, MD;  Location: [place];  Service: Orthopedics;  Laterality: Right;  FAB   CESAREAN SECTION     x 2   JOINT REPLACEMENT     TRIGGER FINGER RELEASE Right 10/18/2018   Procedure: RELEASE TRIGGER FINGER/A-1 PULLEY;  Surgeon: Daryll Brod, MD;  Location: [place];  Service: Orthopedics;  Laterality: Right;  FAB   Social History   Socioeconomic History   Marital status: Married    Spouse name: Not on file   Number of children: 2   Years of education: Not on file   Highest education level: Not on file  Occupational History   Occupation: retired Pharmacist, hospital  Tobacco Use   Smoking status: Never   Smokeless tobacco: Never  Vaping Use   Vaping Use: Never used  Substance and Sexual Activity   Alcohol use: No   Drug use: No   Sexual activity: Not on file  Other Topics Concern   Not on file  Social History Narrative   Not on  file   Social Determinants of Health   Financial Resource Strain: Not on file  Food Insecurity: Not on file  Transportation Needs: Not on file  Physical Activity: Not on file  Stress: Not on file  Social Connections: Not on file  Intimate Partner Violence: Not on file     Medications: Outpatient Medications Prior to Visit  Medication Sig   aspirin EC 81 MG tablet Take 81 mg by mouth daily. Swallow whole.   Calcium Carb-Cholecalciferol [phone] MG-UNIT CAPS Take 1 capsule by mouth 2 (two) times daily.    azithromycin (ZITHROMAX) 250 MG tablet z-pack - take as directed for 5 days   No facility-administered medications prior to visit.    Review of Systems Review of Systems:  A fourteen system review of systems was performed and found to be positive as per HPI.   Last CBC Lab Results  Component Value Date   WBC 6.7 06/12/2022   HGB 13.6 06/12/2022   HCT 40.9 06/12/2022   MCV 92 06/12/2022   MCH 30.6 06/12/2022   RDW 12.3 06/12/2022   PLT 231 56/21/3086   Last metabolic panel Lab Results  Component Value Date   GLUCOSE 93 06/12/2022   NA 139 06/12/2022   K 4.5 06/12/2022   CL 103 06/12/2022   CO2 25 06/12/2022  BUN 16 06/12/2022   CREATININE 0.99 06/12/2022   EGFR 62 06/12/2022   CALCIUM 9.8 06/12/2022   PROT 7.3 06/12/2022   ALBUMIN 4.6 06/12/2022   LABGLOB 2.7 06/12/2022   AGRATIO 1.7 06/12/2022   BILITOT 0.4 06/12/2022   ALKPHOS 72 06/12/2022   AST 19 06/12/2022   ALT 15 06/12/2022   Last lipids Lab Results  Component Value Date   CHOL 249 (H) 06/12/2022   HDL 70 06/12/2022   LDLCALC 161 (H) 06/12/2022   TRIG 103 06/12/2022   CHOLHDL 3.6 06/12/2022   Last hemoglobin A1c Lab Results  Component Value Date   HGBA1C 6.1 (H) 06/12/2022   Last thyroid functions Lab Results  Component Value Date   TSH 2.190 06/12/2022   T3TOTAL 96 04/12/2019   Last vitamin D Lab Results  Component Value Date   VD25OH 55.9 04/12/2019    Objective    BP  133/82 (BP Location: Left Arm, Patient Position: Sitting, Cuff Size: Normal)   Pulse 74   Resp 18   Ht _0  (1.549 m)   Wt 136 lb (61.7 kg)   SpO2 97%   BMI 25.70 kg/m  BP Readings from Last 3 Encounters:  06/23/22 133/82  06/11/21 116/72  06/11/20 (!) 146/80   Wt Readings from Last 3 Encounters:  06/23/22 136 lb (61.7 kg)  07/30/21 135 lb (61.2 kg)  06/11/21 135 lb (61.2 kg)    Physical Exam   General Appearance:       Alert, cooperative, in no acute distress, appears stated age   Head:    Normocephalic, without obvious abnormality, atraumatic  Eyes:    PERRL, conjunctiva/corneas clear, EOM's intact, fundi    benign, both eyes  Ears:    Normal TM's and external ear canals, both ears  Nose:   Nares normal, septum midline, mucosa normal, no drainage    or sinus tenderness  Throat:   Lips, mucosa, and tongue normal; teeth and gums normal  Neck:   Supple, symmetrical, trachea midline, no adenopathy;    thyroid:  no enlargement/tenderness/nodules; no JVD  Back:     Symmetric, no curvature, ROM normal, no CVA tenderness  Lungs:     Clear to auscultation bilaterally, respirations unlabored  Chest Wall:    No tenderness or deformity   Heart:    Normal heart rate. Normal rhythm. No murmurs, rubs, or gallops.   Breast Exam:    deferred  Abdomen:     Soft, non-tender, bowel sounds active all four quadrants,    no masses, no organomegaly  Pelvic:    not indicated; post-menopausal, no abnormal Pap smears in past  Extremities:   All extremities are intact. No cyanosis or edema  Pulses:   2+ and symmetric all extremities  Skin:   Skin color, texture, turgor normal, no rashes or suspicious lesions  Lymph nodes:   Cervical, supraclavicular nodes normal  Neurologic:   CNII-XII grossly intact.     Last depression screening scores    06/23/2022    2:27 PM 07/30/2021   10:23 AM 06/11/2021    8:52 AM  PHQ 2/9 Scores  PHQ - 2 Score 0 0 0  PHQ- 9 Score 1 0 1   Last fall risk  screening    06/23/2022    2:27 PM  [place] in the past year? 0  Number falls in past yr: 0  Injury with Fall? 0     No results found for any visits  on 06/23/22.  Assessment & Plan    Routine Health Maintenance and Physical Exam  Exercise Activities and Dietary recommendations -Discussed heart healthy diet low in fat and carbohydrates. Recommend moderate exercise 150 mins/wk.  Immunization History  Administered Date(s) Administered   Fluad Quad(high Dose 65+) 06/11/2021   Influenza-Unspecified 04/10/2016, 04/10/2017, 05/10/2018   PFIZER(Purple Top)SARS-COV-2 Vaccination 10/12/2019, 11/02/2019   Pneumococcal Conjugate-13 06/15/2020   Tdap 08/14/2015   Zoster Recombinat (Shingrix) 04/12/2019, 06/12/2019   Zoster, Live 08/11/2015    Health Maintenance  Topic Date Due   COVID-19 Vaccine (3 - Pfizer risk series) 11/30/2019   Pneumonia Vaccine 64+ Years old (2 - PPSV23 or PCV20) 06/15/2021   INFLUENZA VACCINE  03/10/2022   Medicare Annual Wellness (AWV)  05/09/2022   MAMMOGRAM  05/20/2023   COLONOSCOPY (Pts 45-109yr Insurance coverage will need to be confirmed)  05/28/2023   DEXA SCAN  01/03/2024   TETANUS/TDAP  08/13/2025   Hepatitis C Screening  Completed   Zoster Vaccines- Shingrix  Completed   HPV VACCINES  Aged Out    Discussed health benefits of physical activity, and encouraged her to engage in regular exercise appropriate for her age and condition.  Problem List Items Addressed This Visit       Endocrine   Glucose intolerance (impaired glucose tolerance) A1c 5.9 January '18 (Chronic)     Other   Elevated LDL cholesterol level (Chronic)   Other Visit Diagnoses     Healthcare maintenance    -  Primary      Discussed with patient most recent lab results which are essentially within normal limits or stable from prior. A1c remains unchanged. Bad cholesterol has increased from prior. Patient declines statin therapy due to side effect concerns of  myalgias/arthralgia. Discussed fish oil and red rice yeast supplements. Recommend repeating cholesterol panel and A1c in 6 months. Deferred flu vaccine. Scheduled for mammogram tomorrow. UTD colonoscopy, bone density and Tdap. Follow-up in 1 year for CPE and FBW or sooner if needed.   Return in about 1 year (around 06/24/2023) for CPE and FBW; lab visit in 6 months for fasting lipid panel, a1c.       MLorrene Reid PA-C  CSouth [place]at FMad[facility] [phone](phone) [phone](fax)  [facility]

## 2022-06-24 ENCOUNTER — Ambulatory Visit
Admission: RE | Admit: 2022-06-24 | Discharge: 2022-06-24 | Disposition: A | Discharge status: Home / Self Care | Payer: Medicare PPO | Source: Ambulatory Visit | Attending: Physician Assistant | Admitting: Physician Assistant

## 2022-06-24 DIAGNOSIS — Z1231 Encounter for screening mammogram for malignant neoplasm of breast: Secondary | ICD-10-CM

## 2022-11-05 ENCOUNTER — Ambulatory Visit
Admission: EM | Admit: 2022-11-05 | Discharge: 2022-11-05 | Disposition: A | Discharge status: Home / Self Care | Payer: Medicare PPO | Attending: Family Medicine | Admitting: Family Medicine

## 2022-11-05 DIAGNOSIS — R030 Elevated blood-pressure reading, without diagnosis of hypertension: Secondary | ICD-10-CM | POA: Diagnosis not present

## 2022-11-05 DIAGNOSIS — J069 Acute upper respiratory infection, unspecified: Secondary | ICD-10-CM | POA: Diagnosis not present

## 2022-11-05 DIAGNOSIS — J029 Acute pharyngitis, unspecified: Secondary | ICD-10-CM | POA: Insufficient documentation

## 2022-11-05 LAB — POCT RAPID STREP A (OFFICE): Rapid Strep A Screen: NEGATIVE

## 2022-11-05 MED ORDER — FLUTICASONE PROPIONATE 50 MCG/ACT NA SUSP: 1.0000 | Freq: Every day | NASAL | 0 refills | Status: DC

## 2022-11-05 MED ORDER — ALBUTEROL SULFATE HFA 108 (90 BASE) MCG/ACT IN AERS: 1.0000 | INHALATION_SPRAY | Freq: Four times a day (QID) | RESPIRATORY_TRACT | 0 refills | Status: DC | PRN

## 2022-11-05 MED ORDER — BENZONATATE 100 MG PO CAPS: 100.0000 mg | ORAL_CAPSULE | Freq: Three times a day (TID) | ORAL | 0 refills | Status: DC | PRN

## 2022-11-05 NOTE — Discharge Instructions (Addendum)
Rapid strep test was negative.  Throat culture is pending.  Will call if it is abnormal.  As we discussed, it appears that you have a viral illness that should run its course and self resolve with the help of symptomatic treatment.  I have prescribed you 3 medications including an albuterol inhaler which will help with excessive coughing and chest tightness.  Use this as needed.  Follow up if any symptoms persist or worsen.

## 2022-11-05 NOTE — ED Triage Notes (Signed)
Pt presents to [facility] with co of tightness in chest, congestion, sinus congestion and cough for 1 week, pt thought this was sinus related as she worked in the yard last week but symptoms continue. She has been using sinus otc and musinex.

## 2022-11-05 NOTE — ED Provider Notes (Signed)
[facility]    CSN: [id number] Arrival date & time: 11/05/22  0808      History   Chief Complaint Chief Complaint  Patient presents with   Cough   Nasal Congestion    HPI Jordan Wade is a 67 y.o. female.   Patient presents with cough, nasal congestion, feelings of chest tightness that started about 4 days ago.  She denies any known sick contacts or fever.  Patient has taken over-the-counter cold and flu medications including Mucinex with no improvement in symptoms.  Denies chest pain, shortness of breath, gastrointestinal symptoms.  Patient denies history of asthma or COPD and does not smoke cigarettes.  Blood pressure is also mildly elevated.  Patient denies history of hypertension or medications for hypertension.  Patient denies headache, blurred vision.   Cough   Past Medical History:  Diagnosis Date   Arthritis    hands   Carpal tunnel syndrome of right wrist    PONV (postoperative nausea and vomiting)    Trigger thumb of right hand     Patient Active Problem List   Diagnosis Date Noted   Diarrhea 01/31/2018   Bronchitis 09/27/2017   Impingement syndrome of right shoulder 09/21/2017   Glucose intolerance (impaired glucose tolerance) A1c 5.9 January '18 08/21/2016   Elevated LDL cholesterol level 08/10/2016   History of osteopenia 04/17/2016   Generalized OA 04/17/2016   Family history of colon cancer-  mom age 41 04/17/2016   Family history of mixed hyperlipidemia 04/17/2016   Family history of stroke or transient ischemic attack in mother 04/17/2016   H/O bilateral hip replacements 04/17/2016    Past Surgical History:  Procedure Laterality Date   BILATERAL ANTERIOR TOTAL HIP ARTHROPLASTY Bilateral 11/04/2012   Procedure: BILATERAL ANTERIOR TOTAL HIP ARTHROPLASTY;  Surgeon: Mcarthur Rossetti, MD;  Location: [facility];  Service: Orthopedics;  Laterality: Bilateral;   CARPAL TUNNEL RELEASE Right 10/18/2018   Procedure: CARPAL TUNNEL RELEASE;   Surgeon: Daryll Brod, MD;  Location: [place];  Service: Orthopedics;  Laterality: Right;  FAB   CESAREAN SECTION     x 2   JOINT REPLACEMENT     TRIGGER FINGER RELEASE Right 10/18/2018   Procedure: RELEASE TRIGGER FINGER/A-1 PULLEY;  Surgeon: Daryll Brod, MD;  Location: [place];  Service: Orthopedics;  Laterality: Right;  FAB    OB History   No obstetric history on file.      Home Medications    Prior to Admission medications   Medication Sig Start Date End Date Taking? Authorizing Provider  albuterol (VENTOLIN HFA) 108 (90 Base) MCG/ACT inhaler Inhale 1-2 puffs into the lungs every 6 (six) hours as needed for wheezing or shortness of breath. 11/05/22  Yes Sirenity Shew, [place] E, FNP  benzonatate (TESSALON) 100 MG capsule Take 1 capsule (100 mg total) by mouth every 8 (eight) hours as needed for cough. 11/05/22  Yes Kaycie Pegues, Hildred Alamin E, FNP  fluticasone (FLONASE) 50 MCG/ACT nasal spray Place 1 spray into both nostrils daily. 11/05/22  Yes Oswaldo [place] E, FNP  aspirin EC 81 MG tablet Take 81 mg by mouth daily. Swallow whole.    [provider]  azithromycin (ZITHROMAX) 250 MG tablet z-pack - take as directed for 5 days 07/30/21   Ronnell Freshwater, NP  Calcium Carb-Cholecalciferol [phone] MG-UNIT CAPS Take 1 capsule by mouth 2 (two) times daily.     [provider]    Family History Family History  Problem Relation Age of  Onset   Hyperlipidemia Mother    Stroke Mother    Colon cancer Mother    Hypertension Father    Diabetes Maternal Grandfather    Colon cancer Maternal Grandfather    Heart attack Maternal Grandfather    Heart disease Maternal Grandfather    Colon cancer Cousin        and paternal greatgrandfather   Breast cancer Paternal Aunt        apprx. 75    Social History Social History   Tobacco Use   Smoking status: Never   Smokeless tobacco: Never  Vaping Use   Vaping Use: Never used  Substance Use Topics   Alcohol use:  No   Drug use: No     Allergies   Patient has no known allergies.   Review of Systems Review of Systems Per HPI  Physical Exam Triage Vital Signs ED Triage Vitals [11/05/22 0832]  Enc Vitals Group     BP (!) 167/85     Pulse Rate 78     Resp 20     Temp 98.6 F (37 C)     Temp src      SpO2 98 %     Weight      Height      Head Circumference      Peak Flow      Pain Score 6     Pain Loc      Pain Edu?      Excl. in [place]?    No data found.  Updated Vital Signs BP (!) 156/94   Pulse 78   Temp 98.6 F (37 C)   Resp 20   SpO2 98%   Visual Acuity Right Eye Distance:   Left Eye Distance:   Bilateral Distance:    Right Eye Near:   Left Eye Near:    Bilateral Near:     Physical Exam Constitutional:      General: She is not in acute distress.    Appearance: Normal appearance. She is not toxic-appearing or diaphoretic.  HENT:     Head: Normocephalic and atraumatic.     Right Ear: Tympanic membrane and ear canal normal.     Left Ear: Tympanic membrane and ear canal normal.     Nose: Congestion present.     Mouth/Throat:     Mouth: Mucous membranes are moist.     Pharynx: Posterior oropharyngeal erythema present.  Eyes:     Extraocular Movements: Extraocular movements intact.     Conjunctiva/sclera: Conjunctivae normal.     Pupils: Pupils are equal, round, and reactive to light.  Cardiovascular:     Rate and Rhythm: Normal rate and regular rhythm.     Pulses: Normal pulses.     Heart sounds: Normal heart sounds.  Pulmonary:     Effort: Pulmonary effort is normal. No respiratory distress.     Breath sounds: Normal breath sounds. No stridor. No wheezing, rhonchi or rales.  Abdominal:     General: Abdomen is flat. Bowel sounds are normal.     Palpations: Abdomen is soft.  Musculoskeletal:        General: Normal range of motion.     Cervical back: Normal range of motion.  Skin:    General: Skin is warm and dry.  Neurological:     General: No focal  deficit present.     Mental Status: She is alert and oriented to person, place, and time. Mental status is at baseline.  Psychiatric:  Mood and Affect: Mood normal.        Behavior: Behavior normal.      UC Treatments / Results  Labs (all labs ordered are listed, but only abnormal results are displayed) Labs Reviewed  CULTURE, GROUP A STREP [facility])  POCT RAPID STREP A (OFFICE)    EKG   Radiology No results found.  Procedures Procedures (including critical care time)  Medications Ordered in UC Medications - No data to display  Initial Impression / Assessment and Plan / UC Course  I have reviewed the triage vital signs and the nursing notes.  Pertinent labs & imaging results that were available during my care of the patient were reviewed by me and considered in my medical decision making (see chart for details).     Patient presents with symptoms likely from a viral upper respiratory infection. Do not suspect underlying cardiopulmonary process. Symptoms seem unlikely related to ACS, CHF or COPD exacerbations, pneumonia, pneumothorax. Patient is nontoxic appearing and not in need of emergent medical intervention.  Rapid strep is negative.  Throat culture pending.  Patient declined COVID testing.  There are no adventitious lung sounds on exam despite patient report of chest tightness so do not think that chest imaging is necessary.  There is no concern for cardiac etiology at this time given that coughing is most likely causing chest tightness.  Will prescribe albuterol inhaler to help treat this.  Cough medication and nasal spray also prescribed for patient.  Blood pressure is mildly elevated today with recheck being slightly improved.  She denies history of hypertension which is also consistent with patient's chart review.  It is most likely elevated due to daily cold medications.  Advised patient to monitor blood pressure closely at home and follow-up with PCP or urgent care  if it remains elevated.  She is asymptomatic regarding blood pressure which is reassuring.   Return if symptoms fail to improve. Patient states understanding and is agreeable.  Discharged with PCP followup.  Final Clinical Impressions(s) / UC Diagnoses   Final diagnoses:  Viral upper respiratory tract infection with cough  Sore throat  Elevated blood pressure reading     Discharge Instructions      Rapid strep test was negative.  Throat culture is pending.  Will call if it is abnormal.  As we discussed, it appears that you have a viral illness that should run its course and self resolve with the help of symptomatic treatment.  I have prescribed you 3 medications including an albuterol inhaler which will help with excessive coughing and chest tightness.  Use this as needed.  Follow up if any symptoms persist or worsen.     ED Prescriptions     Medication Sig Dispense Auth. Provider   albuterol (VENTOLIN HFA) 108 (90 Base) MCG/ACT inhaler Inhale 1-2 puffs into the lungs every 6 (six) hours as needed for wheezing or shortness of breath. 1 each [place], Hildred Alamin E, FNP   fluticasone [facility]) 50 MCG/ACT nasal spray Place 1 spray into both nostrils daily. 16 g Sherrill [place], Hildred Alamin E, [place]   benzonatate (TESSALON) 100 MG capsule Take 1 capsule (100 mg total) by mouth every 8 (eight) hours as needed for cough. 21 capsule [place], Michele Rockers, [place]      PDMP not reviewed this encounter.   Teodora Medici, [place] 11/05/22 [phone]

## 2022-11-08 LAB — CULTURE, GROUP A STREP (THRC)

## 2022-11-30 ENCOUNTER — Telehealth: Payer: Self-pay

## 2022-11-30 NOTE — Telephone Encounter (Signed)
Called patient to schedule Medicare Annual Wellness Visit (AWV). Left message for patient to call back and schedule Medicare Annual Wellness Visit (AWV).  Last date of AWV: 05/09/21  Please schedule an appointment at any time on Annual Wellness Visit schedule.    Agnes Lawrence, CMA (AAMA)  [facility]- AWV Program

## 2022-12-10 ENCOUNTER — Other Ambulatory Visit: Payer: Self-pay

## 2022-12-10 DIAGNOSIS — Z0000 Encounter for general adult medical examination without abnormal findings: Secondary | ICD-10-CM

## 2022-12-10 DIAGNOSIS — Z130 Encounter for screening for diseases of the blood and blood-forming organs and certain disorders involving the immune mechanism: Secondary | ICD-10-CM

## 2022-12-10 DIAGNOSIS — R7302 Impaired glucose tolerance (oral): Secondary | ICD-10-CM

## 2022-12-22 ENCOUNTER — Other Ambulatory Visit: Payer: Medicare PPO

## 2022-12-22 DIAGNOSIS — Z130 Encounter for screening for diseases of the blood and blood-forming organs and certain disorders involving the immune mechanism: Secondary | ICD-10-CM

## 2022-12-22 DIAGNOSIS — Z0000 Encounter for general adult medical examination without abnormal findings: Secondary | ICD-10-CM

## 2022-12-22 DIAGNOSIS — R7302 Impaired glucose tolerance (oral): Secondary | ICD-10-CM

## 2022-12-23 ENCOUNTER — Encounter: Payer: Self-pay | Admitting: Family Medicine

## 2022-12-23 DIAGNOSIS — R7303 Prediabetes: Secondary | ICD-10-CM | POA: Insufficient documentation

## 2022-12-23 DIAGNOSIS — E118 Type 2 diabetes mellitus with unspecified complications: Secondary | ICD-10-CM | POA: Insufficient documentation

## 2022-12-23 LAB — HEMOGLOBIN A1C
Est. average glucose Bld gHb Est-mCnc: 140 mg/dL
Hgb A1c MFr Bld: 6.5 % — ABNORMAL HIGH (ref 4.8–5.6)

## 2022-12-23 LAB — LIPID PANEL
Chol/HDL Ratio: 3.9 ratio (ref 0.0–4.4)
Cholesterol, Total: 244 mg/dL — ABNORMAL HIGH (ref 100–199)
HDL: 63 mg/dL (ref >39)
LDL Chol Calc (NIH): 167 mg/dL — ABNORMAL HIGH (ref 0–99)
Triglycerides: 83 mg/dL (ref 0–149)
VLDL Cholesterol Cal: 14 mg/dL (ref 5–40)

## 2023-03-08 ENCOUNTER — Telehealth: Payer: Self-pay | Admitting: *Deleted

## 2023-03-08 NOTE — Telephone Encounter (Signed)
They will be leaving on Sunday for this trip.

## 2023-03-08 NOTE — Telephone Encounter (Signed)
Pt calling asking about a medication that helps prevent parasites and sickness while visiting a third world country.  She is leaving soon to go to [place] and she is having surgery when she comes back. Please advise.

## 2023-03-09 NOTE — Telephone Encounter (Signed)
I would recommend the patient see if she can get an appointment with the travel clinic if possible although that may be difficult with less than a week before her trip.  The CDC website recommends that in addition to routine vaccinations, the patient should be vaccinated against hepatitis A and typhoid vaccination.  They also recommend antimalarial medications.  There are multiple options for antimalarial prophylaxis.  If she would like me to prescribe this medication for her or if she is unable to get appointment with a travel specialist she would need to schedule an appointment with me (it can be virtual) so we can discuss options and side effects.  She should also look at the [facility] travel website for [place] for other advice and recommendations. [email or web address]

## 2023-03-09 NOTE — Telephone Encounter (Signed)
Pt informed of below, she will try and connect with the travel clinic.

## 2023-03-22 ENCOUNTER — Ambulatory Visit
Admission: EM | Admit: 2023-03-22 | Discharge: 2023-03-22 | Disposition: A | Discharge status: Home / Self Care | Payer: Medicare PPO

## 2023-03-22 DIAGNOSIS — J019 Acute sinusitis, unspecified: Secondary | ICD-10-CM | POA: Diagnosis not present

## 2023-03-22 DIAGNOSIS — H65193 Other acute nonsuppurative otitis media, bilateral: Secondary | ICD-10-CM

## 2023-03-22 MED ORDER — AMOXICILLIN-POT CLAVULANATE 875-125 MG PO TABS: 1.0000 | ORAL_TABLET | Freq: Two times a day (BID) | ORAL | 0 refills | Status: DC

## 2023-03-22 NOTE — ED Triage Notes (Signed)
"  Recent international travel. When flying back into states yesterday started with ear pain in both ears and I need to know if there is an infection. I have an upcoming knee surgery".

## 2023-03-23 NOTE — ED Provider Notes (Signed)
[facility]    CSN: [id number] Arrival date & time: 03/22/23  1351      History   Chief Complaint Chief Complaint  Patient presents with   Otalgia    HPI Jordan Wade is a 67 y.o. female.   Patient here today for evaluation of bilateral ear pain after recently flying.  She is not sure if she has an infection but states she has an upcoming knee surgery.  She has had sinus pressure as well.  She denies any fever.  She has not had any vomiting or diarrhea.  The history is provided by the patient.  Otalgia Associated symptoms: congestion   Associated symptoms: no abdominal pain, no cough, no diarrhea, no fever, no sore throat and no vomiting     Past Medical History:  Diagnosis Date   Arthritis    hands   Carpal tunnel syndrome of right wrist    PONV (postoperative nausea and vomiting)    Trigger thumb of right hand     Patient Active Problem List   Diagnosis Date Noted   Controlled diabetes mellitus type 2 with complications (HCC) 12/23/2022   Diarrhea 01/31/2018   Bronchitis 09/27/2017   Impingement syndrome of right shoulder 09/21/2017   Glucose intolerance (impaired glucose tolerance) A1c 5.9 January '18 08/21/2016   Elevated LDL cholesterol level 08/10/2016   History of osteopenia 04/17/2016   Generalized OA 04/17/2016   Family history of colon cancer-  mom age 65 04/17/2016   Family history of mixed hyperlipidemia 04/17/2016   Family history of stroke or transient ischemic attack in mother 04/17/2016   H/O bilateral hip replacements 04/17/2016    Past Surgical History:  Procedure Laterality Date   BILATERAL ANTERIOR TOTAL HIP ARTHROPLASTY Bilateral 11/04/2012   Procedure: BILATERAL ANTERIOR TOTAL HIP ARTHROPLASTY;  Surgeon: Kathryne Hitch, MD;  Location: [facility];  Service: Orthopedics;  Laterality: Bilateral;   CARPAL TUNNEL RELEASE Right 10/18/2018   Procedure: CARPAL TUNNEL RELEASE;  Surgeon: Cindee Salt, MD;  Location: [facility];  Service: Orthopedics;  Laterality: Right;  FAB   CESAREAN SECTION     x 2   JOINT REPLACEMENT     TRIGGER FINGER RELEASE Right 10/18/2018   Procedure: RELEASE TRIGGER FINGER/A-1 PULLEY;  Surgeon: Cindee Salt, MD;  Location:  SUR[facility]ERY CENTER;  Service: Orthopedics;  Laterality: Right;  FAB    OB History   No obstetric history on file.      Home Medications    Prior to Admission medications   Medication Sig Start Date End Date Taking? Authorizing Provider  amoxicillin-clavulanate (AUGMENTIN) 875-125 MG tablet Take 1 tablet by mouth every 12 (twelve) hours. 03/22/23  Yes Tomi Bamberger, PA-C  aspirin EC 81 MG tablet Take 81 mg by mouth daily. Swallow whole.   Yes [provider]  Calcium Carb-Cholecalciferol [phone] MG-UNIT CAPS Take 1 capsule by mouth 2 (two) times daily.    Yes [provider]  CALCIUM PO    Yes [provider]  fluticasone (FLONASE) 50 MCG/ACT nasal spray Place 1 spray into both nostrils daily. 11/05/22  Yes [place], Acie Fredrickson, FNP  Omega-3 Fatty Acids (FISH OIL PO)    Yes [provider]  albuterol (VENTOLIN HFA) 108 (90 Base) MCG/ACT inhaler Inhale 1-2 puffs into the lungs every 6 (six) hours as needed for wheezing or shortness of breath. 11/05/22   Gustavus Bryant, FNP  azithromycin (ZITHROMAX) 250 MG tablet z-pack - take as directed for  5 days 07/30/21   Carlean Jews, NP  benzonatate (TESSALON) 100 MG capsule Take 1 capsule (100 mg total) by mouth every 8 (eight) hours as needed for cough. 11/05/22   Gustavus Bryant, FNP    Family History Family History  Problem Relation Age of Onset   Hyperlipidemia Mother    Stroke Mother    Colon cancer Mother    Hypertension Father    Diabetes Maternal Grandfather    Colon cancer Maternal Grandfather    Heart attack Maternal Grandfather    Heart disease Maternal Grandfather    Colon cancer Cousin        and paternal greatgrandfather   Breast cancer Paternal  Aunt        apprx. 45    Social History Social History   Tobacco Use   Smoking status: Never   Smokeless tobacco: Never  Vaping Use   Vaping status: Never Used  Substance Use Topics   Alcohol use: No   Drug use: No     Allergies   Patient has no known allergies.   Review of Systems Review of Systems  Constitutional:  Negative for chills and fever.  HENT:  Positive for congestion, ear pain and sinus pressure. Negative for sore throat.   Eyes:  Negative for discharge and redness.  Respiratory:  Negative for cough, shortness of breath and wheezing.   Gastrointestinal:  Negative for abdominal pain, diarrhea, nausea and vomiting.     Physical Exam Triage Vital Signs ED Triage Vitals  Encounter Vitals Group     BP 03/22/23 1410 122/73     Systolic BP Percentile --      Diastolic BP Percentile --      Pulse Rate 03/22/23 1410 81     Resp 03/22/23 1410 18     Temp 03/22/23 1410 97.8 F (36.6 C)     Temp Source 03/22/23 1410 Oral     SpO2 03/22/23 1410 99 %     Weight 03/22/23 1408 136 lb (61.7 kg)     Height 03/22/23 1408 5\' 1"  (1.549 m)     Head Circumference --      Peak Flow --      Pain Score 03/22/23 1408 4     Pain Loc --      Pain Education --      Exclude from Growth Chart --    No data found.  Updated Vital Signs BP 122/73 (BP Location: Right Arm)   Pulse 81   Temp 97.8 F (36.6 C) (Oral)   Resp 18   Ht 5\' 1"  (1.549 m)   Wt 136 lb (61.7 kg)   SpO2 99%   BMI 25.70 kg/m       Physical Exam Vitals and nursing note reviewed.  Constitutional:      General: She is not in acute distress.    Appearance: Normal appearance. She is not ill-appearing.  HENT:     Head: Normocephalic and atraumatic.     Ears:     Comments: Bilateral TMs dull and erythematous    Nose: Congestion present.     Mouth/Throat:     Mouth: Mucous membranes are moist.     Pharynx: No oropharyngeal exudate or posterior oropharyngeal erythema.  Eyes:     Conjunctiva/sclera:  Conjunctivae normal.  Cardiovascular:     Rate and Rhythm: Normal rate and regular rhythm.     Heart sounds: Normal heart sounds. No murmur heard. Pulmonary:     Effort:  Pulmonary effort is normal. No respiratory distress.     Breath sounds: Normal breath sounds. No wheezing, rhonchi or rales.  Skin:    General: Skin is warm and dry.  Neurological:     Mental Status: She is alert.  Psychiatric:        Mood and Affect: Mood normal.        Thought Content: Thought content normal.      UC Treatments / Results  Labs (all labs ordered are listed, but only abnormal results are displayed) Labs Reviewed - No data to display  EKG   Radiology No results found.  Procedures Procedures (including critical care time)  Medications Ordered in UC Medications - No data to display  Initial Impression / Assessment and Plan / UC Course  I have reviewed the triage vital signs and the nursing notes.  Pertinent labs & imaging results that were available during my care of the patient were reviewed by me and considered in my medical decision making (see chart for details).    Will treat to cover sinusitis and otitis media given upcoming surgery.  Recommended follow-up if no gradual improvement or with any further concerns.  Final Clinical Impressions(s) / UC Diagnoses   Final diagnoses:  Acute sinusitis, recurrence not specified, unspecified location  Other acute nonsuppurative otitis media of both ears, recurrence not specified   Discharge Instructions   None    ED Prescriptions     Medication Sig Dispense Auth. Provider   amoxicillin-clavulanate (AUGMENTIN) 875-125 MG tablet Take 1 tablet by mouth every 12 (twelve) hours. 14 tablet Tomi Bamberger, PA-C      PDMP not reviewed this encounter.   Tomi Bamberger, PA-C 03/23/23 [phone]

## 2023-04-06 ENCOUNTER — Ambulatory Visit: Payer: Medicare PPO

## 2023-04-06 VITALS — Ht 61.0 in | Wt 136.0 lb

## 2023-04-06 DIAGNOSIS — Z0000 Encounter for general adult medical examination without abnormal findings: Secondary | ICD-10-CM

## 2023-04-06 NOTE — Patient Instructions (Addendum)
Ms. Jemmott , Thank you for taking time to come for your Medicare Wellness Visit. I appreciate your ongoing commitment to your health goals. Please review the following plan we discussed and let me know if I can assist you in the future.   Referrals/Orders/Follow-Ups/Clinician Recommendations:   This is a list of the screening recommended for you and due dates:  Health Maintenance  Topic Date Due   Complete foot exam   Never done   Eye exam for diabetics  Never done   Yearly kidney health urinalysis for diabetes  Never done   COVID-19 Vaccine (3 - Pfizer risk series) 11/30/2019   Pneumonia Vaccine (2 of 2 - PPSV23 or PCV20) 06/15/2021   Flu Shot  03/11/2023   Colon Cancer Screening  05/28/2023   Yearly kidney function blood test for diabetes  06/13/2023   Hemoglobin A1C  06/24/2023   DEXA scan (bone density measurement)  01/03/2024   Medicare Annual Wellness Visit  04/05/2024   Mammogram  06/24/2024   DTaP/Tdap/Td vaccine (2 - Td or Tdap) 08/13/2025   Hepatitis C Screening  Completed   Zoster (Shingles) Vaccine  Completed   HPV Vaccine  Aged Out    Advanced directives: (Copy Requested) Please bring a copy of your health care power of attorney and living will to the office to be added to your chart at your convenience.  Next Medicare Annual Wellness Visit scheduled for next year: Yes

## 2023-04-06 NOTE — Progress Notes (Signed)
Subjective:   Jordan Wade is a 67 y.o. female who presents for Medicare Annual (Subsequent) preventive examination.  Visit Complete: Virtual  I connected with  Jordan Wade on 04/06/23 by a audio enabled telemedicine application and verified that I am speaking with the correct person using two identifiers.  Patient Location: Home  Provider Location: Home Office  I discussed the limitations of evaluation and management by telemedicine. The patient expressed understanding and agreed to proceed.    Review of Systems    Vital Signs: Unable to obtain new vitals due to this being a telehealth visit.  Cardiac Risk Factors include: advanced age (>25men, >41 women)     Objective:    Today's Vitals   04/06/23 0911 04/06/23 0913  Weight: 136 lb (61.7 kg)   Height: 5\' 1"  (1.549 m)   PainSc:  0-No pain   Body mass index is 25.7 kg/m.     04/06/2023    9:18 AM 10/18/2018    9:06 AM 10/10/2018   11:27 AM 02/18/2017    9:31 AM 04/15/2016    3:02 PM 11/07/2012   10:50 AM 11/04/2012    6:00 PM  Advanced Directives  Does Patient Have a Medical Advance Directive? Yes Yes Yes Yes Yes  Patient has advance directive, copy not in chart  Type of Advance Directive Healthcare Power of [place];Living will Healthcare Power of [place];Living will Healthcare Power of [place];Living will Healthcare Power of [employer];Living will Healthcare Power of Attorney  Living will;Healthcare Power of Attorney  Does patient want to make changes to medical advance directive?  No - Patient declined No - Patient declined      Copy of Healthcare Power of Attorney in Chart? No - copy requested No - copy requested     Copy requested from other (Comment)  Pre-existing out of facility DNR order (yellow form or pink MOST form)      No No    Current Medications (verified) Outpatient Encounter Medications as of 04/06/2023  Medication Sig   amoxicillin-clavulanate (AUGMENTIN) 875-125 MG tablet Take 1 tablet by mouth  every 12 (twelve) hours.   aspirin EC 81 MG tablet Take 81 mg by mouth daily. Swallow whole.   benzonatate (TESSALON) 100 MG capsule Take 1 capsule (100 mg total) by mouth every 8 (eight) hours as needed for cough.   Calcium Carb-Cholecalciferol [phone] MG-UNIT CAPS Take 1 capsule by mouth 2 (two) times daily.    CALCIUM PO    fluticasone (FLONASE) 50 MCG/ACT nasal spray Place 1 spray into both nostrils daily.   Omega-3 Fatty Acids (FISH OIL PO)    [DISCONTINUED] albuterol (VENTOLIN HFA) 108 (90 Base) MCG/ACT inhaler Inhale 1-2 puffs into the lungs every 6 (six) hours as needed for wheezing or shortness of breath.   [DISCONTINUED] azithromycin (ZITHROMAX) 250 MG tablet z-pack - take as directed for 5 days   No facility-administered encounter medications on file as of 04/06/2023.    Allergies (verified) Patient has no known allergies.   History: Past Medical History:  Diagnosis Date   Arthritis    hands   Carpal tunnel syndrome of right wrist    PONV (postoperative nausea and vomiting)    Trigger thumb of right hand    Past Surgical History:  Procedure Laterality Date   BILATERAL ANTERIOR TOTAL HIP ARTHROPLASTY Bilateral 11/04/2012   Procedure: BILATERAL ANTERIOR TOTAL HIP ARTHROPLASTY;  Surgeon: Kathryne Hitch, MD;  Location: [facility];  Service: Orthopedics;  Laterality: Bilateral;   CARPAL TUNNEL  RELEASE Right 10/18/2018   Procedure: CARPAL TUNNEL RELEASE;  Surgeon: Cindee Salt, MD;  Location: [facility];  Service: Orthopedics;  Laterality: Right;  FAB   CESAREAN SECTION     x 2   JOINT REPLACEMENT     TRIGGER FINGER RELEASE Right 10/18/2018   Procedure: RELEASE TRIGGER FINGER/A-1 PULLEY;  Surgeon: Cindee Salt, MD;  Location: [facility];  Service: Orthopedics;  Laterality: Right;  FAB   Family History  Problem Relation Age of Onset   Hyperlipidemia Mother    Stroke Mother    Colon cancer Mother    Hypertension Father    Diabetes Maternal  Grandfather    Colon cancer Maternal Grandfather    Heart attack Maternal Grandfather    Heart disease Maternal Grandfather    Colon cancer Cousin        and paternal greatgrandfather   Breast cancer Paternal Aunt        apprx. 45   Social History   Socioeconomic History   Marital status: Married    Spouse name: Not on file   Number of children: 2   Years of education: Not on file   Highest education level: Not on file  Occupational History   Occupation: retired Runner, broadcasting/film/video  Tobacco Use   Smoking status: Never   Smokeless tobacco: Never  Vaping Use   Vaping status: Never Used  Substance and Sexual Activity   Alcohol use: No   Drug use: No   Sexual activity: Not Currently    Birth control/protection: Post-menopausal  Other Topics Concern   Not on file  Social History Narrative   Not on file   Social Determinants of Health   Financial Resource Strain: Low Risk  (04/06/2023)   Overall Financial Resource Strain (CARDIA)    Difficulty of Paying Living Expenses: Not hard at all  Food Insecurity: No Food Insecurity (04/06/2023)   Hunger Vital Sign    Worried About Running Out of Food in the Last Year: Never true    Ran Out of Food in the Last Year: Never true  Transportation Needs: No Transportation Needs (04/06/2023)   PRAPARE - Administrator, Civil Service (Medical): No    Lack of Transportation (Non-Medical): No  Physical Activity: Sufficiently Active (04/06/2023)   Exercise Vital Sign    Days of Exercise per Week: 4 days    Minutes of Exercise per Session: 60 min  Stress: No Stress Concern Present (04/06/2023)   [employer] of Occupational Health - Occupational Stress Questionnaire    Feeling of Stress : Not at all  Social Connections: Socially Integrated (04/06/2023)   Social Connection and Isolation Panel [NHANES]    Frequency of Communication with Friends and Family: More than three times a week    Frequency of Social Gatherings with Friends and  Family: More than three times a week    Attends Religious Services: More than 4 times per year    Active Member of [employer] or Organizations: Yes    Attends Engineer, structural: More than 4 times per year    Marital Status: Married    Tobacco Counseling Counseling given: Not Answered   Clinical Intake:  Pre-visit preparation completed: Yes  Pain : No/denies pain Pain Score: 0-No pain     BMI - recorded: 25.7 Nutritional Status: BMI 25 -29 Overweight Nutritional Risks: None Diabetes: No  How often do you need to have someone help you when you read instructions, pamphlets, or other written  materials from your doctor or pharmacy?: 1 - Never  Interpreter Needed?: No  Information entered by :: Theresa Mulligan LPN   Activities of Daily Living    04/06/2023    9:16 AM 06/23/2022    2:27 PM  In your present state of health, do you have any difficulty performing the following activities:  Hearing? 0 0  Vision? 0 0  Difficulty concentrating or making decisions? 0 0  Walking or climbing stairs? 0 0  Dressing or bathing? 0 0  Doing errands, shopping? 0 0  Preparing Food and eating ? N   Using the Toilet? N   In the past six months, have you accidently leaked urine? N   Do you have problems with loss of bowel control? N   Managing your Medications? N   Managing your Finances? N   Housekeeping or managing your Housekeeping? N     Patient Care Team: Melida Quitter, PA as PCP - General (Family Medicine) Kathryne Hitch, MD as Consulting Physician (Orthopedic Surgery) Zenovia Jordan, MD as Consulting Physician (Rheumatology) Elmon Else, MD as Consulting Physician (Dermatology)  Indicate any recent Medical Services you may have received from other than [facility] providers in the past year (date may be approximate).     Assessment:   This is a routine wellness examination for [facility].  Hearing/Vision screen Hearing Screening - Comments:: Denies hearing  difficulties   Vision Screening - Comments:: Wears rx glasses - up to date with routine eye exams with  [facility]  Dietary issues and exercise activities discussed:     Goals Addressed               This Visit's Progress     Increase physical activity (pt-stated)        Maintain Good health.       Depression Screen    04/06/2023    9:16 AM 06/23/2022    2:27 PM 07/30/2021   10:23 AM 06/11/2021    8:52 AM 05/09/2021   10:30 AM 06/11/2020    1:14 PM 04/12/2019    8:18 AM  PHQ 2/9 Scores  PHQ - 2 Score 0 0 0 0 0 0 0  PHQ- 9 Score  1 0 1 0 0 0    Fall Risk    04/06/2023    9:17 AM 06/23/2022    2:27 PM 07/30/2021   10:24 AM 06/11/2021    8:52 AM 05/09/2021   10:29 AM  Fall Risk   Falls in the past year? 0 0 0 0 0  Number falls in past yr: 0 0 0 0 0  Injury with Fall? 0 0 0 0 0  Risk for fall due to : No Fall Risks   No Fall Risks No Fall Risks  Follow up Falls prevention discussed  Falls evaluation completed Falls evaluation completed Falls evaluation completed    MEDICARE RISK AT HOME: Medicare Risk at Home Any stairs in or around the home?: Yes If so, are there any without handrails?: No Home free of loose throw rugs in walkways, pet beds, electrical cords, etc?: Yes Adequate lighting in your home to reduce risk of falls?: Yes Life alert?: No Use of a cane, walker or w/c?: No Grab bars in the bathroom?: Yes Shower chair or bench in shower?: No Elevated toilet seat or a handicapped toilet?: Yes  TIMED UP AND GO:  Was the test performed?  No    Cognitive Function:  04/06/2023    9:18 AM 05/09/2021   10:28 AM  6CIT Screen  What Year? 0 points 0 points  What month? 0 points 0 points  What time? 0 points 0 points  Count back from 20 0 points 0 points  Months in reverse 0 points 0 points  Repeat phrase 0 points 0 points  Total Score 0 points 0 points    Immunizations Immunization History  Administered Date(s) Administered   Fluad Quad(high  Dose 65+) 06/11/2021   Influenza-Unspecified 04/10/2016, 04/10/2017, 05/10/2018   PFIZER(Purple Top)SARS-COV-2 Vaccination 10/12/2019, 11/02/2019   Pneumococcal Conjugate-13 06/15/2020   Tdap 08/14/2015   Zoster Recombinant(Shingrix) 04/12/2019, 06/12/2019   Zoster, Live 08/11/2015    TDAP status: Up to date  Flu Vaccine status: Due, Education has been provided regarding the importance of this vaccine. Advised may receive this vaccine at local pharmacy or Health Dept. Aware to provide a copy of the vaccination record if obtained from local pharmacy or Health Dept. Verbalized acceptance and understanding.  Pneumococcal vaccine status: Due, Education has been provided regarding the importance of this vaccine. Advised may receive this vaccine at local pharmacy or Health Dept. Aware to provide a copy of the vaccination record if obtained from local pharmacy or Health Dept. Verbalized acceptance and understanding.  Covid-19 vaccine status: Declined, Education has been provided regarding the importance of this vaccine but patient still declined. Advised may receive this vaccine at local pharmacy or Health Dept.or vaccine clinic. Aware to provide a copy of the vaccination record if obtained from local pharmacy or Health Dept. Verbalized acceptance and understanding.  Qualifies for Shingles Vaccine? Yes   Zostavax completed Yes   Shingrix Completed?: Yes  Screening Tests Health Maintenance  Topic Date Due   FOOT EXAM  Never done   OPHTHALMOLOGY EXAM  Never done   Diabetic kidney evaluation - Urine ACR  Never done   COVID-19 Vaccine (3 - Pfizer risk series) 11/30/2019   Pneumonia Vaccine 67+ Years old (2 of 2 - PPSV23 or PCV20) 06/15/2021   INFLUENZA VACCINE  03/11/2023   Colonoscopy  05/28/2023   Diabetic kidney evaluation - eGFR measurement  06/13/2023   HEMOGLOBIN A1C  06/24/2023   DEXA SCAN  01/03/2024   Medicare Annual Wellness (AWV)  04/05/2024   MAMMOGRAM  06/24/2024   DTaP/Tdap/Td  (2 - Td or Tdap) 08/13/2025   Hepatitis C Screening  Completed   Zoster Vaccines- Shingrix  Completed   HPV VACCINES  Aged Out    Health Maintenance  Health Maintenance Due  Topic Date Due   FOOT EXAM  Never done   OPHTHALMOLOGY EXAM  Never done   Diabetic kidney evaluation - Urine ACR  Never done   COVID-19 Vaccine (3 - Pfizer risk series) 11/30/2019   Pneumonia Vaccine 12+ Years old (2 of 2 - PPSV23 or PCV20) 06/15/2021   INFLUENZA VACCINE  03/11/2023   Colonoscopy  05/28/2023    Colorectal cancer screening: Type of screening: Colonoscopy. Completed 05/27/18. Repeat every 5 years  Mammogram status: Completed 06/24/22. Repeat every year  Bone Density status: Completed 01/02/22. Results reflect: Bone density results: OSTEOPENIA. Repeat every   years.  Lung Cancer Screening: (Low Dose CT Chest recommended if Age 23-80 years, 20 pack-year currently smoking OR have quit w/in 15years.) does not qualify.     Additional Screening:  Hepatitis C Screening: does qualify; Completed 08/20/16  Vision Screening: Recommended annual ophthalmology exams for early detection of glaucoma and other disorders of the eye. Is the patient up  to date with their annual eye exam?  Yes  Who is the provider or what is the name of the office in which the patient attends annual eye exams? [facility] If pt is not established with a provider, would they like to be referred to a provider to establish care? No .   Dental Screening: Recommended annual dental exams for proper oral hygiene   Community Resource Referral / Chronic Care Management:  CRR required this visit?  No   CCM required this visit?  No     Plan:     I have personally reviewed and noted the following in the patient's chart:   Medical and social history Use of alcohol, tobacco or illicit drugs  Current medications and supplements including opioid prescriptions. Patient is not currently taking opioid prescriptions. Functional  ability and status Nutritional status Physical activity Advanced directives List of other physicians Hospitalizations, surgeries, and ER visits in previous 12 months Vitals Screenings to include cognitive, depression, and falls Referrals and appointments  In addition, I have reviewed and discussed with patient certain preventive protocols, quality metrics, and best practice recommendations. A written personalized care plan for preventive services as well as general preventive health recommendations were provided to patient.     Tillie Rung, LPN   10/31/4008   After Visit Summary: (MyChart) Due to this being a telephonic visit, the after visit summary with patients personalized plan was offered to patient via MyChart   Nurse Notes: None

## 2023-04-13 ENCOUNTER — Encounter: Payer: Self-pay | Admitting: Internal Medicine

## 2023-04-15 ENCOUNTER — Emergency Department (HOSPITAL_COMMUNITY): Payer: Medicare PPO

## 2023-04-15 ENCOUNTER — Encounter (HOSPITAL_COMMUNITY): Payer: Self-pay | Admitting: Emergency Medicine

## 2023-04-15 ENCOUNTER — Emergency Department (HOSPITAL_COMMUNITY)
Admission: EM | Admit: 2023-04-15 | Discharge: 2023-04-15 | Disposition: A | Discharge status: Home / Self Care | Payer: Medicare PPO | Attending: Emergency Medicine | Admitting: Emergency Medicine

## 2023-04-15 ENCOUNTER — Other Ambulatory Visit: Payer: Self-pay

## 2023-04-15 DIAGNOSIS — Z7982 Long term (current) use of aspirin: Secondary | ICD-10-CM | POA: Insufficient documentation

## 2023-04-15 DIAGNOSIS — R55 Syncope and collapse: Secondary | ICD-10-CM | POA: Diagnosis present

## 2023-04-15 DIAGNOSIS — X58XXXA Exposure to other specified factors, initial encounter: Secondary | ICD-10-CM | POA: Diagnosis not present

## 2023-04-15 DIAGNOSIS — S0993XA Unspecified injury of face, initial encounter: Secondary | ICD-10-CM

## 2023-04-15 DIAGNOSIS — S00502A Unspecified superficial injury of oral cavity, initial encounter: Secondary | ICD-10-CM | POA: Insufficient documentation

## 2023-04-15 LAB — CBC WITH DIFFERENTIAL/PLATELET
Abs Immature Granulocytes: 0.04 10*3/uL (ref 0.00–0.07)
Basophils Absolute: 0 10*3/uL (ref 0.0–0.1)
Basophils Relative: 0 %
Eosinophils Absolute: 0.2 10*3/uL (ref 0.0–0.5)
Eosinophils Relative: 2 %
HCT: 35.8 % — ABNORMAL LOW (ref 36.0–46.0)
Hemoglobin: 11.8 g/dL — ABNORMAL LOW (ref 12.0–15.0)
Immature Granulocytes: 0 %
Lymphocytes Relative: 19 %
Lymphs Abs: 1.9 10*3/uL (ref 0.7–4.0)
MCH: 30.2 pg (ref 26.0–34.0)
MCHC: 33 g/dL (ref 30.0–36.0)
MCV: 91.6 fL (ref 80.0–100.0)
Monocytes Absolute: 1.1 10*3/uL — ABNORMAL HIGH (ref 0.1–1.0)
Monocytes Relative: 12 %
Neutro Abs: 6.5 10*3/uL (ref 1.7–7.7)
Neutrophils Relative %: 67 %
Platelets: 205 10*3/uL (ref 150–400)
RBC: 3.91 MIL/uL (ref 3.87–5.11)
RDW: 12.6 % (ref 11.5–15.5)
WBC: 9.7 10*3/uL (ref 4.0–10.5)
nRBC: 0 % (ref 0.0–0.2)

## 2023-04-15 LAB — BASIC METABOLIC PANEL
Anion gap: 12 (ref 5–15)
BUN: 20 mg/dL (ref 8–23)
CO2: 23 mmol/L (ref 22–32)
Calcium: 9 mg/dL (ref 8.9–10.3)
Chloride: 103 mmol/L (ref 98–111)
Creatinine, Ser: 0.89 mg/dL (ref 0.44–1.00)
GFR, Estimated: >60 mL/min (ref >60)
Glucose, Bld: 121 mg/dL — ABNORMAL HIGH (ref 70–99)
Potassium: 3.7 mmol/L (ref 3.5–5.1)
Sodium: 138 mmol/L (ref 135–145)

## 2023-04-15 LAB — CBG MONITORING, ED: Glucose-Capillary: 112 mg/dL — ABNORMAL HIGH (ref 70–99)

## 2023-04-15 MED ORDER — SODIUM CHLORIDE 0.9 % IV BOLUS
1000.0000 mL | Freq: Once | INTRAVENOUS | Status: AC
  Administered 2023-04-15: 1000 mL via INTRAVENOUS

## 2023-04-15 MED ORDER — PENICILLIN V POTASSIUM 500 MG PO TABS
500.0000 mg | ORAL_TABLET | Freq: Three times a day (TID) | ORAL | 0 refills | Status: AC
Start: 2023-04-15 — End: 2023-04-25

## 2023-04-15 NOTE — ED Triage Notes (Signed)
Patient BIB [facility] after having a syncopal episode while assisting her husband in the bathroom just prior to arrival. Patient remembers standing there and suddenly feeling dizzy, fell forward on her face. Dried blood noted to her nare, small cut on her temple on the R side, and 4 front upper teeth appear to be damaged. Patient's son states she lost consciousness for 5 minutes EMS placed in c-collar for precautions. Reports a frontal headache. Denies neck or back pain. BP initially 100/60 HR 110 EMS gave 50 mls of NS w/ BP improvement to 150 systolic.

## 2023-04-15 NOTE — Discharge Instructions (Addendum)
Liquid diet. Follow up with your dentist today, call first thing in the morning. Penicillin as prescribed due to dental injury. Return to the ER for worsening or concerning symptoms.

## 2023-04-15 NOTE — ED Provider Notes (Signed)
[facility]MENT AT [facility] Provider Note   CSN: [id number] Arrival date & time: 04/15/23  0346     History  Chief Complaint  Patient presents with   Fall   Loss of Consciousness    Jordan Wade is a 67 y.o. female.  67 year old female brought in by EMS from home for syncopal episode. Patient with recent right knee surgery, husband was dc home from the hospital yesterday following a hip replacement. Husband woke up tonight to go to the bathroom, patient was helping him use his walker (she is supposed to be on crutches), while standing in the bathroom she began to feel dizzy and passed out, hitting her face on the ground. Patient woke up about 5 minutes later as EMS arrived. Placed in c-collar, reports front teeth feel like they don't line up. Not on thinners. Reports frontal headache, no other injuries, specifically- no injury to her right/surgical knee. No cardiac history. Did not take any medications prior to bed.        Home Medications Prior to Admission medications   Medication Sig Start Date End Date Taking? Authorizing Provider  aspirin EC 81 MG tablet Take 81 mg by mouth daily. Swallow whole.   Yes [provider]  Calcium Carb-Cholecalciferol [phone] MG-UNIT CAPS Take 1 capsule by mouth 2 (two) times daily.    Yes [provider]  COENZYME Q10 PO Take 1 tablet by mouth daily.   Yes [provider]  Omega-3 Fatty Acids (FISH OIL PO) Take 1 tablet by mouth daily.   Yes [provider]  penicillin v potassium (VEETID) 500 MG tablet Take 1 tablet (500 mg total) by mouth 3 (three) times daily for 10 days. 04/15/23 04/25/23 Yes Jeannie Fend, PA-C      Allergies    Patient has no known allergies.    Review of Systems   Review of Systems Negative except as per HPI Physical Exam Updated Vital Signs BP 135/65   Pulse 83   Temp 98.1 F (36.7 C) (Axillary)   Resp 17   Ht 5\' 1"  (1.549 m)   Wt 61 kg   SpO2  100%   BMI 25.41 kg/m  Physical Exam Vitals and nursing note reviewed.  Constitutional:      General: She is not in acute distress.    Appearance: She is well-developed. She is not diaphoretic.     Interventions: Cervical collar in place.  HENT:     Head: Normocephalic.      Comments: Dried blood to nose Minor wound to right lip, does not require closure.     Nose:     Right Nostril: No septal hematoma.     Left Nostril: No septal hematoma.     Mouth/Throat:     Mouth: Mucous membranes are moist.     Comments: Tenderness upper cental incisors, mild bleeding right upper central incisor.  Eyes:     Extraocular Movements: Extraocular movements intact.     Pupils: Pupils are equal, round, and reactive to light.  Pulmonary:     Effort: Pulmonary effort is normal.  Abdominal:     Palpations: Abdomen is soft.     Tenderness: There is no abdominal tenderness.  Musculoskeletal:     Right lower leg: No edema.     Left lower leg: No edema.     Comments: Brace on right leg, incisions appear clean/dry/intact  Skin:    General: Skin is warm and dry.  Findings: No erythema or rash.  Neurological:     Mental Status: She is alert and oriented to person, place, and time.  Psychiatric:        Behavior: Behavior normal.     ED Results / Procedures / Treatments   Labs (all labs ordered are listed, but only abnormal results are displayed) Labs Reviewed  BASIC METABOLIC PANEL - Abnormal; Notable for the following components:      Result Value   Glucose, Bld 121 (*)    All other components within normal limits  CBC WITH DIFFERENTIAL/PLATELET - Abnormal; Notable for the following components:   Hemoglobin 11.8 (*)    HCT 35.8 (*)    Monocytes Absolute 1.1 (*)    All other components within normal limits  CBG MONITORING, ED - Abnormal; Notable for the following components:   Glucose-Capillary 112 (*)    All other components within normal limits    EKG None  Radiology CT  Cervical Spine Wo Contrast  Result Date: 04/15/2023 CLINICAL DATA:  67 year old female status post syncope and fall face forward. EXAM: CT CERVICAL SPINE WITHOUT CONTRAST TECHNIQUE: Multidetector CT imaging of the cervical spine was performed without intravenous contrast. Multiplanar CT image reconstructions were also generated. RADIATION DOSE REDUCTION: This exam was performed according to the departmental dose-optimization program which includes automated exposure control, adjustment of the mA and/or kV according to patient size and/or use of iterative reconstruction technique. COMPARISON:  CT head and face today. FINDINGS: Alignment: Straightening of cervical lordosis with subtle degenerative appearing anterolisthesis of C3 on C4. Associated upper cervical facet arthropathy greater on right side. Cervicothoracic junction alignment is within normal limits. Bilateral posterior element alignment is within normal limits. Skull base and vertebrae: Bone mineralization is within normal limits. Visualized skull base is intact. No atlanto-occipital dissociation. C1 and C2 appear intact and aligned. No acute osseous abnormality identified. Soft tissues and spinal canal: No prevertebral fluid or swelling. No visible canal hematoma. Negative visible noncontrast neck soft tissues. Disc levels: Moderate degenerative facet arthropathy mostly on the right side at C2-C3 and C3-C4. Associated minor anterolisthesis at the latter. But otherwise mild for age cervical spine degeneration, capacious CT appearance of the cervical spine. Upper chest: Visible upper thoracic levels appear grossly intact and there is mild for age apical lung scarring. IMPRESSION: 1. No acute traumatic injury identified in the cervical spine. 2. Mild degenerative C3-C4 anterolisthesis with facet arthropathy greater on the right. Otherwise mild for age cervical spine degeneration. Electronically Signed   By: Odessa Fleming M.D.   On: 04/15/2023 04:47   CT  Maxillofacial WO CM  Result Date: 04/15/2023 CLINICAL DATA:  67 year old female status post syncope and fall face forward. EXAM: CT MAXILLOFACIAL WITHOUT CONTRAST TECHNIQUE: Multidetector CT imaging of the maxillofacial structures was performed. Multiplanar CT image reconstructions were also generated. RADIATION DOSE REDUCTION: This exam was performed according to the departmental dose-optimization program which includes automated exposure control, adjustment of the mA and/or kV according to patient size and/or use of iterative reconstruction technique. COMPARISON:  CT head and cervical spine today. FINDINGS: Osseous: Mandible intact and normally located. No acute dental finding. Bilateral maxilla, zygoma, pterygoid bones, and nasal bones appear intact. Central skull base appears intact. Orbits: Intact orbital walls. Globes and intraorbital soft tissues appear symmetric and intact. No periorbital soft tissue swelling identified. Sinuses: Clear throughout. Soft tissues: Negative visible noncontrast larynx, pharynx, parapharyngeal spaces, retropharyngeal space, sublingual space, submandibular spaces, masticator and parotid spaces. No soft tissue gas or superficial  soft tissue injury identified. Upper cervical lymph nodes are within normal limits. Limited intracranial: Stable to that reported separately. IMPRESSION: No acute traumatic injury identified in the Face. Electronically Signed   By: Odessa Fleming M.D.   On: 04/15/2023 04:44   CT Head Wo Contrast  Result Date: 04/15/2023 CLINICAL DATA:  67 year old female status post syncope and fall face forward. EXAM: CT HEAD WITHOUT CONTRAST TECHNIQUE: Contiguous axial images were obtained from the base of the skull through the vertex without intravenous contrast. RADIATION DOSE REDUCTION: This exam was performed according to the departmental dose-optimization program which includes automated exposure control, adjustment of the mA and/or kV according to patient size and/or  use of iterative reconstruction technique. COMPARISON:  Face and cervical spine CT today reported separately. FINDINGS: Brain: Cerebral volume is within normal limits for age. No midline shift, ventriculomegaly, mass effect, evidence of mass lesion, intracranial hemorrhage or evidence of cortically based acute infarction. Gray-white matter differentiation is within normal limits throughout the brain. Vascular: No suspicious intracranial vascular hyperdensity. Mild Calcified atherosclerosis at the skull base. Skull: No fracture identified. Sinuses/Orbits: Visualized paranasal sinuses and mastoids are clear. Other: No discrete orbit or scalp soft tissue injury identified. IMPRESSION: 1.  No acute traumatic injury identified in the head. 2. Normal for age noncontrast CT appearance of the brain. 3. Face and Cervical Spine CT reported separately. Electronically Signed   By: Odessa Fleming M.D.   On: 04/15/2023 04:41    Procedures Procedures    Medications Ordered in ED Medications  sodium chloride 0.9 % bolus 1,000 mL (0 mLs Intravenous Stopped 04/15/23 0540)    ED Course/ Medical Decision Making/ A&P                                 Medical Decision Making Amount and/or Complexity of Data Reviewed Labs: ordered. Radiology: ordered.   This patient presents to the ED for concern of syncope, this involves an extensive number of treatment options, and is a complaint that carries with it a high risk of complications and morbidity.  The differential diagnosis includes arrhythmia, metabolic/electrolyte derangement, intracranial injury, c-spine injury, facial fracture, dental injury, orthostatic hypotension    Co morbidities that complicate the patient evaluation  Recent knee surgery (2 weeks ago, on 81mg  ASA), osteopenia   Additional history obtained:  Additional history obtained from son at bedside who contributes to history as above  External records from outside source obtained and reviewed including  prior labs on file for comparison    Lab Tests:  I Ordered, and personally interpreted labs.  The pertinent results include:  CBC, CBG, BMP without significant findings   Imaging Studies ordered:  I ordered imaging studies including CT head, c-spine, max.fac  I independently visualized and interpreted imaging which showed no acute findings/injury  I agree with the radiologist interpretation   Cardiac Monitoring: / EKG:  The patient was maintained on a cardiac monitor.  I personally viewed and interpreted the cardiac monitored which showed an underlying rhythm of: sinus rhythm, rate 80   Consultations Obtained:  I requested consultation with the ER attending, Dr. Pilar Plate,  and discussed lab and imaging findings as well as pertinent plan - they recommend: agrees with plan of care   Problem List / ED Course / Critical interventions / Medication management  67 year old female brought in by EMS for syncopal episode. Reports feeling dizzy prior to event. Feels like teeth  do not properly align, slight bleeding at the gum line of the right central incision. CT imaging of head, c-spine, face reassuring. Labs without significant findings. HR increased with standing, BP stable. Patient reports not drinking much water post surgery with being on crutches. Provided with IVF, feels improved. Plan is for PCN at dc with plan to call her DDS this morning to be seen today for evaluation of the teeth, possibility of tooth loss long term. Return to the ER for any concerning symptoms.  I ordered medication including IVF  for tachycardia with standing   Reevaluation of the patient after these medicines showed that the patient improved I have reviewed the patients home medicines and have made adjustments as needed   Social Determinants of Health:  Lives with family   Test / Admission - Considered:  Stable for dc with plan to see dentist today         Final Clinical Impression(s) / ED  Diagnoses Final diagnoses:  Syncope and collapse  Dental injury, initial encounter    Rx / DC Orders ED Discharge Orders          Ordered    penicillin v potassium (VEETID) 500 MG tablet  3 times daily        04/15/23 0501              Jeannie Fend, PA-C 04/15/23 0610    Sabas Sous, MD 04/15/23 [phone]

## 2023-05-11 ENCOUNTER — Encounter: Payer: Self-pay | Admitting: Internal Medicine

## 2023-06-07 ENCOUNTER — Ambulatory Visit (AMBULATORY_SURGERY_CENTER): Payer: Medicare PPO

## 2023-06-07 VITALS — Ht 61.0 in | Wt 136.0 lb

## 2023-06-07 DIAGNOSIS — Z800 Family history of malignant neoplasm of digestive organs: Secondary | ICD-10-CM

## 2023-06-07 DIAGNOSIS — Z860100 Personal history of colon polyps, unspecified: Secondary | ICD-10-CM

## 2023-06-07 MED ORDER — NA SULFATE-K SULFATE-MG SULF 17.5-3.13-1.6 GM/177ML PO SOLN
1.0000 | Freq: Once | ORAL | 0 refills | Status: AC
Start: 2023-06-07 — End: 2023-06-07

## 2023-06-07 NOTE — Progress Notes (Signed)
Pre visit completed via phone call; Patient verified name, DOB, and address; No egg or soy allergy known to patient;  No issues known to pt with past sedation with any surgeries or procedures----other than PONV; Patient denies ever being told they had issues or difficulty with intubation----other than PONV; No FH of Malignant Hyperthermia; Pt is not on diet pills; Pt is not on home 02;  Pt is not on blood thinners; Pt denies issues with constipation; No A fib or A flutter; Have any cardiac testing pending--NO Insurance verified during PV appt--- [employer] Medicare Pt can ambulate without assistance;  Pt denies use of chewing tobacco; Discussed diabetic/weight loss medication holds; Discussed NSAID holds; Checked BMI to be less than 50; Pt instructed to use [employer] or GoodRx for a price reduction on prep;  Patient's chart reviewed by Cathlyn Parsons CNRA prior to previsit and patient appropriate for the [facility];  Pre visit completed and red dot placed by patient's name on their procedure day (on provider's schedule);    Instructions sent to MyChart per patient request;

## 2023-06-14 DIAGNOSIS — M25561 Pain in right knee: Secondary | ICD-10-CM | POA: Diagnosis not present

## 2023-06-17 DIAGNOSIS — M25561 Pain in right knee: Secondary | ICD-10-CM | POA: Diagnosis not present

## 2023-06-18 ENCOUNTER — Encounter: Payer: Self-pay | Admitting: Internal Medicine

## 2023-06-21 DIAGNOSIS — M25561 Pain in right knee: Secondary | ICD-10-CM | POA: Diagnosis not present

## 2023-06-24 ENCOUNTER — Encounter: Payer: Self-pay | Admitting: Family Medicine

## 2023-06-24 ENCOUNTER — Ambulatory Visit (INDEPENDENT_AMBULATORY_CARE_PROVIDER_SITE_OTHER): Payer: Medicare PPO | Admitting: Family Medicine

## 2023-06-24 VITALS — BP 130/70 | HR 69 | Resp 18 | Ht 61.0 in | Wt 141.0 lb

## 2023-06-24 DIAGNOSIS — Z1321 Encounter for screening for nutritional disorder: Secondary | ICD-10-CM

## 2023-06-24 DIAGNOSIS — Z1329 Encounter for screening for other suspected endocrine disorder: Secondary | ICD-10-CM

## 2023-06-24 DIAGNOSIS — E7800 Pure hypercholesterolemia, unspecified: Secondary | ICD-10-CM

## 2023-06-24 DIAGNOSIS — M25561 Pain in right knee: Secondary | ICD-10-CM | POA: Diagnosis not present

## 2023-06-24 DIAGNOSIS — Z0000 Encounter for general adult medical examination without abnormal findings: Secondary | ICD-10-CM

## 2023-06-24 DIAGNOSIS — R7303 Prediabetes: Secondary | ICD-10-CM | POA: Diagnosis not present

## 2023-06-24 NOTE — Patient Instructions (Addendum)
PREVENTATIVE CARE You are doing a fantastic job staying on top of your preventative care!  Just a reminder of what we discussed at your visit today: -Schedule your mammogram as you mentioned -Get your one-time pneumonia PCV 20 immunization at the pharmacy Otherwise, you are up-to-date on all of your preventative care!

## 2023-06-24 NOTE — Progress Notes (Signed)
Complete physical exam  Patient: Jordan Wade   DOB: 1956-01-23   67 y.o. Female  MRN: [id number]  Subjective:    Chief Complaint  Patient presents with   Annual Exam    Fasting     Jordan Wade is a 67 y.o. female who presents today for a complete physical exam. She reports consuming a general diet.  She had knee surgery in August and is still recovering.  She sees her surgeon today for her final follow-up and is also finishing physical therapy.  She has been under increased stress particularly today as she is preparing for a colonoscopy tomorrow and taking care of several doctors appointments.  She generally feels fairly well. She reports sleeping fairly well. She does not have additional problems to discuss today.    Most recent fall risk assessment:    06/24/2023    8:16 AM  Fall Risk   Falls in the past year? 1  Number falls in past yr: 0  Injury with Fall? 1  Risk for fall due to : History of fall(s)  Follow up Falls evaluation completed     Most recent depression and anxiety screenings:    06/24/2023    8:18 AM 04/06/2023    9:16 AM  PHQ 2/9 Scores  PHQ - 2 Score 0 0  PHQ- 9 Score 0       06/24/2023    8:18 AM 06/23/2022    2:27 PM 07/30/2021   10:25 AM 06/11/2021    8:52 AM  GAD 7 : Generalized Anxiety Score  Nervous, Anxious, on Edge 0 0 0 1  Control/stop worrying 0 0 0 0  Worry too much - different things 0 0 0 0  Trouble relaxing 0 0 0 0  Restless 0 0 0 0  Easily annoyed or irritable 0 0 0 1  Afraid - awful might happen 0 0 0 0  Total GAD 7 Score 0 0 0 2  Anxiety Difficulty Not difficult at all Not difficult at all  Not difficult at all    Patient Active Problem List   Diagnosis Date Noted   Prediabetes 12/23/2022   Elevated LDL cholesterol level 08/10/2016   History of osteopenia 04/17/2016   Generalized OA 04/17/2016    Past Surgical History:  Procedure Laterality Date   BILATERAL ANTERIOR TOTAL HIP ARTHROPLASTY Bilateral 11/04/2012    Procedure: BILATERAL ANTERIOR TOTAL HIP ARTHROPLASTY;  Surgeon: Kathryne Hitch, MD;  Location: [facility];  Service: Orthopedics;  Laterality: Bilateral;   CARPAL TUNNEL RELEASE Right 10/18/2018   Procedure: CARPAL TUNNEL RELEASE;  Surgeon: Cindee Salt, MD;  Location: [facility];  Service: Orthopedics;  Laterality: Right;  FAB   CESAREAN SECTION     x 2   JOINT REPLACEMENT  March 2014   Bilateral hip relacement   TRIGGER FINGER RELEASE Right 10/18/2018   Procedure: RELEASE TRIGGER FINGER/A-1 PULLEY;  Surgeon: Cindee Salt, MD;  Location: [facility];  Service: Orthopedics;  Laterality: Right;  FAB   TUBAL LIGATION     Social History   Tobacco Use   Smoking status: Never    Passive exposure: Never   Smokeless tobacco: Never  Vaping Use   Vaping status: Never Used  Substance Use Topics   Alcohol use: No   Drug use: No   Family History  Problem Relation Age of Onset   Colon polyps Mother 33   Hyperlipidemia Mother    Stroke Mother  Colon cancer Mother 79   Arthritis Mother    Cancer Mother    Hypertension Father    Cancer Father    Breast cancer Paternal Aunt        apprx. 45   Colon polyps Maternal Grandfather 61   Diabetes Maternal Grandfather    Colon cancer Maternal Grandfather 61   Heart attack Maternal Grandfather    Heart disease Maternal Grandfather    Colon cancer Cousin        and paternal greatgrandfather   Esophageal cancer Neg Hx    Rectal cancer Neg Hx    Stomach cancer Neg Hx    No Known Allergies   Patient Care Team: Melida Quitter, PA as PCP - General (Family Medicine) Kathryne Hitch, MD as Consulting Physician (Orthopedic Surgery) Zenovia Jordan, MD as Consulting Physician (Rheumatology) Elmon Else, MD as Consulting Physician (Dermatology)   Outpatient Medications Prior to Visit  Medication Sig   aspirin EC 81 MG tablet Take 81 mg by mouth daily. Swallow whole.   Calcium Carb-Cholecalciferol  [phone] MG-UNIT CAPS Take 1 capsule by mouth 2 (two) times daily.    COENZYME Q10 PO Take 1 tablet by mouth daily.   Omega-3 Fatty Acids (FISH OIL PO) Take 1 tablet by mouth 3 (three) times a week.   No facility-administered medications prior to visit.    Review of Systems  Constitutional:  Negative for chills, fever and malaise/fatigue.  HENT:  Negative for congestion and hearing loss.   Eyes:  Negative for blurred vision and double vision.  Respiratory:  Negative for cough and shortness of breath.   Cardiovascular:  Negative for chest pain, palpitations and leg swelling.  Gastrointestinal:  Negative for abdominal pain, constipation, diarrhea and heartburn.  Genitourinary:  Negative for frequency and urgency.  Musculoskeletal:  Negative for myalgias and neck pain.  Neurological:  Negative for headaches.  Endo/Heme/Allergies:  Negative for polydipsia.  Psychiatric/Behavioral:  Negative for depression. The patient has insomnia (occasional). The patient is not nervous/anxious.       Objective:    BP 130/70 Comment: home BP  Pulse 69   Resp 18   Ht 5\' 1"  (1.549 m)   Wt 141 lb (64 kg)   SpO2 99%   BMI 26.64 kg/m    Physical Exam Constitutional:      General: She is not in acute distress.    Appearance: Normal appearance.  HENT:     Head: Normocephalic and atraumatic.     Right Ear: Tympanic membrane, ear canal and external ear normal. There is no impacted cerumen.     Left Ear: Tympanic membrane, ear canal and external ear normal. There is no impacted cerumen.     Nose: Nose normal.     Mouth/Throat:     Mouth: Mucous membranes are moist.     Pharynx: No oropharyngeal exudate or posterior oropharyngeal erythema.  Eyes:     Extraocular Movements: Extraocular movements intact.     Conjunctiva/sclera: Conjunctivae normal.     Pupils: Pupils are equal, round, and reactive to light.     Comments: Wearing contacts, appt due in February 2025  Neck:     Thyroid: No thyroid  mass, thyromegaly or thyroid tenderness.  Cardiovascular:     Rate and Rhythm: Normal rate and regular rhythm.     Heart sounds: Normal heart sounds. No murmur heard.    No friction rub. No gallop.  Pulmonary:     Effort: Pulmonary effort is normal. No respiratory  distress.     Breath sounds: Normal breath sounds. No wheezing, rhonchi or rales.  Abdominal:     General: Abdomen is flat. Bowel sounds are normal. There is no distension.     Palpations: There is no mass.     Tenderness: There is no abdominal tenderness. There is no guarding.  Musculoskeletal:        General: Normal range of motion.     Cervical back: Normal range of motion and neck supple.  Lymphadenopathy:     Cervical: No cervical adenopathy.  Skin:    General: Skin is warm and dry.  Neurological:     Mental Status: She is alert and oriented to person, place, and time.     Cranial Nerves: No cranial nerve deficit.     Motor: No weakness.     Deep Tendon Reflexes: Reflexes normal.  Psychiatric:        Mood and Affect: Mood normal.       Assessment & Plan:    Routine Health Maintenance and Physical Exam  Immunization History  Administered Date(s) Administered   Fluad Quad(high Dose 65+) 06/11/2021   Hepatitis A, Adult 09/19/1998, 07/29/1999   Hepatitis B, ADULT 12/20/2006, 01/21/2007, 08/12/2007   IPV 12/10/1999   Influenza-Unspecified 04/10/2016, 04/10/2017, 05/10/2018   PFIZER(Purple Top)SARS-COV-2 Vaccination 10/12/2019, 11/02/2019   Pneumococcal Conjugate-13 06/15/2020   Tdap 08/14/2015   Typhoid Live 12/30/2004   Yellow Fever 02/17/2006   Zoster Recombinant(Shingrix) 04/12/2019, 06/12/2019   Zoster, Live 08/11/2015    Health Maintenance  Topic Date Due   Pneumonia Vaccine 41+ Years old (2 of 2 - PPSV23 or PCV20) 06/15/2021   Colonoscopy  05/28/2023   COVID-19 Vaccine (3 - 2023-24 season) 07/10/2023 (Originally 04/11/2023)   INFLUENZA VACCINE  11/08/2023 (Originally 03/11/2023)   DEXA SCAN   01/03/2024   Medicare Annual Wellness (AWV)  04/05/2024   MAMMOGRAM  06/24/2024   DTaP/Tdap/Td (2 - Td or Tdap) 08/13/2025   Hepatitis C Screening  Completed   Zoster Vaccines- Shingrix  Completed   HPV VACCINES  Aged Out    Patient fasting today, collecting labs including CBC, CMP, lipid panel, A1C, TSH, and vitamin D.  Colonoscopy scheduled for tomorrow. Declines flu vaccine.  Discussed pneumonia vaccine recommendations, planning on updating with PCV 20 at pharmacy.  Discussed health benefits of physical activity, and encouraged her to engage in regular exercise appropriate for her age and condition.  Wellness examination  Prediabetes -     CBC with Differential/Platelet; Future -     Comprehensive metabolic panel; Future -     Hemoglobin A1c; Future  Elevated LDL cholesterol level -     CBC with Differential/Platelet; Future -     Comprehensive metabolic panel; Future -     Lipid panel; Future  Screening for thyroid disorder -     TSH Rfx on Abnormal to Free T4; Future  Encounter for vitamin deficiency screening -     VITAMIN D 25 Hydroxy (Vit-D Deficiency, Fractures); Future  Blood pressure elevated in the office today, discussed increase in stressors that are likely contributing.  Patient does occasionally check blood pressure at home, states that values typically are around 130/70.  Return in about 1 year (around 06/23/2024) for annual physical, fasting blood work 1 week before.     Melida Quitter, PA

## 2023-06-25 ENCOUNTER — Ambulatory Visit: Payer: Medicare PPO | Admitting: Internal Medicine

## 2023-06-25 ENCOUNTER — Encounter: Payer: Self-pay | Admitting: Internal Medicine

## 2023-06-25 VITALS — BP 123/73 | HR 67 | Temp 97.8°F | Resp 9 | Ht 61.0 in | Wt 136.0 lb

## 2023-06-25 DIAGNOSIS — Z09 Encounter for follow-up examination after completed treatment for conditions other than malignant neoplasm: Secondary | ICD-10-CM | POA: Diagnosis not present

## 2023-06-25 DIAGNOSIS — Z860100 Personal history of colon polyps, unspecified: Secondary | ICD-10-CM

## 2023-06-25 DIAGNOSIS — Z1211 Encounter for screening for malignant neoplasm of colon: Secondary | ICD-10-CM | POA: Diagnosis not present

## 2023-06-25 DIAGNOSIS — Z800 Family history of malignant neoplasm of digestive organs: Secondary | ICD-10-CM

## 2023-06-25 DIAGNOSIS — E785 Hyperlipidemia, unspecified: Secondary | ICD-10-CM | POA: Diagnosis not present

## 2023-06-25 DIAGNOSIS — D128 Benign neoplasm of rectum: Secondary | ICD-10-CM

## 2023-06-25 DIAGNOSIS — K621 Rectal polyp: Secondary | ICD-10-CM | POA: Diagnosis not present

## 2023-06-25 LAB — COMPREHENSIVE METABOLIC PANEL
ALT: 11 [IU]/L (ref 0–32)
AST: 15 [IU]/L (ref 0–40)
Albumin: 4.3 g/dL (ref 3.9–4.9)
Alkaline Phosphatase: 78 [IU]/L (ref 44–121)
BUN/Creatinine Ratio: 19 (ref 12–28)
BUN: 16 mg/dL (ref 8–27)
Bilirubin Total: 0.3 mg/dL (ref 0.0–1.2)
CO2: 25 mmol/L (ref 20–29)
Calcium: 9.8 mg/dL (ref 8.7–10.3)
Chloride: 102 mmol/L (ref 96–106)
Creatinine, Ser: 0.86 mg/dL (ref 0.57–1.00)
Globulin, Total: 2.7 g/dL (ref 1.5–4.5)
Glucose: 92 mg/dL (ref 70–99)
Potassium: 4.5 mmol/L (ref 3.5–5.2)
Sodium: 141 mmol/L (ref 134–144)
Total Protein: 7 g/dL (ref 6.0–8.5)
eGFR: 73 mL/min/{1.73_m2} (ref >59)

## 2023-06-25 LAB — CBC WITH DIFFERENTIAL/PLATELET
Basophils Absolute: 0 10*3/uL (ref 0.0–0.2)
Basos: 1 %
EOS (ABSOLUTE): 0.2 10*3/uL (ref 0.0–0.4)
Eos: 3 %
Hematocrit: 41.3 % (ref 34.0–46.6)
Hemoglobin: 13.1 g/dL (ref 11.1–15.9)
Immature Grans (Abs): 0 10*3/uL (ref 0.0–0.1)
Immature Granulocytes: 0 %
Lymphocytes Absolute: 2.2 10*3/uL (ref 0.7–3.1)
Lymphs: 35 %
MCH: 29.6 pg (ref 26.6–33.0)
MCHC: 31.7 g/dL (ref 31.5–35.7)
MCV: 93 fL (ref 79–97)
Monocytes Absolute: 0.7 10*3/uL (ref 0.1–0.9)
Monocytes: 11 %
Neutrophils Absolute: 3.1 10*3/uL (ref 1.4–7.0)
Neutrophils: 50 %
Platelets: 249 10*3/uL (ref 150–450)
RBC: 4.43 x10E6/uL (ref 3.77–5.28)
RDW: 12.8 % (ref 11.7–15.4)
WBC: 6.2 10*3/uL (ref 3.4–10.8)

## 2023-06-25 LAB — LIPID PANEL
Chol/HDL Ratio: 4.2 ratio (ref 0.0–4.4)
Cholesterol, Total: 235 mg/dL — ABNORMAL HIGH (ref 100–199)
HDL: 56 mg/dL (ref >39)
LDL Chol Calc (NIH): 154 mg/dL — ABNORMAL HIGH (ref 0–99)
Triglycerides: 139 mg/dL (ref 0–149)
VLDL Cholesterol Cal: 25 mg/dL (ref 5–40)

## 2023-06-25 LAB — HEMOGLOBIN A1C
Est. average glucose Bld gHb Est-mCnc: 123 mg/dL
Hgb A1c MFr Bld: 5.9 % — ABNORMAL HIGH (ref 4.8–5.6)

## 2023-06-25 LAB — VITAMIN D 25 HYDROXY (VIT D DEFICIENCY, FRACTURES): Vit D, 25-Hydroxy: 54.2 ng/mL (ref 30.0–100.0)

## 2023-06-25 LAB — TSH RFX ON ABNORMAL TO FREE T4: TSH: 2.75 u[IU]/mL (ref 0.450–4.500)

## 2023-06-25 MED ORDER — SODIUM CHLORIDE 0.9 % IV SOLN: 500.0000 mL | Freq: Once | INTRAVENOUS | Status: AC

## 2023-06-25 NOTE — Op Note (Signed)
[facility] Patient Name: Jordan Wade Procedure Date: 06/25/2023 12:00 PM MRN: [id number] Endoscopist: Wilhemina Bonito. Marina Goodell , MD, [id number] Age: 67 Referring MD:  Date of Birth: February 06, 1956 Gender: Female Account #: [id number] Procedure:                Colonoscopy with cold snare polypectomy x 1 Indications:              High risk colon cancer surveillance: Personal                            history of non-advanced adenoma. Previous                            examinations 2015 (Dr. Laural Benes) in 2019. Also                            strong family history of colon cancer in mother and                            multiple second-degree relatives Medicines:                Monitored Anesthesia Care Procedure:                Pre-Anesthesia Assessment:                           - Prior to the procedure, a History and Physical                            was performed, and patient medications and                            allergies were reviewed. The patient's tolerance of                            previous anesthesia was also reviewed. The risks                            and benefits of the procedure and the sedation                            options and risks were discussed with the patient.                            All questions were answered, and informed consent                            was obtained. Prior Anticoagulants: The patient has                            taken no anticoagulant or antiplatelet agents. ASA                            Grade Assessment: II - A patient with mild systemic  disease. After reviewing the risks and benefits,                            the patient was deemed in satisfactory condition to                            undergo the procedure.                           After obtaining informed consent, the colonoscope                            was passed under direct vision. Throughout the                             procedure, the patient's blood pressure, pulse, and                            oxygen saturations were monitored continuously. The                            CF [id number] #[id number] was introduced through the anus                            and advanced to the the cecum, identified by                            appendiceal orifice and ileocecal valve. The                            ileocecal valve, appendiceal orifice, and rectum                            were photographed. The quality of the bowel                            preparation was excellent. The colonoscopy was                            performed without difficulty. The patient tolerated                            the procedure well. The bowel preparation used was                            SUPREP via split dose instruction. Scope In: 12:14:21 PM Scope Out: 12:24:38 PM Scope Withdrawal Time: 0 hours 6 minutes 40 seconds  Total Procedure Duration: 0 hours 10 minutes 17 seconds  Findings:                 A 2 mm polyp was found in the rectum. The polyp was                            removed with a cold snare. Resection and  retrieval                            were complete.                           Multiple diverticula were found in the sigmoid                            colon. Small internal hemorrhoids.                           The exam was otherwise without abnormality on                            direct and retroflexion views. Complications:            No immediate complications. Estimated blood loss:                            None. Estimated Blood Loss:     Estimated blood loss: none. Impression:               - One 2 mm polyp in the rectum, removed with a cold                            snare. Resected and retrieved.                           - Diverticulosis in the sigmoid colon.                           - The examination was otherwise normal on direct                            and retroflexion views. Recommendation:            - Repeat colonoscopy in 5 years for surveillance                            (family history).                           - Patient has a contact number available for                            emergencies. The signs and symptoms of potential                            delayed complications were discussed with the                            patient. Return to normal activities tomorrow.                            Written discharge instructions were provided to the  patient.                           - Resume previous diet.                           - Continue present medications.                           - Await pathology results. Wilhemina Bonito. Marina Goodell, MD 06/25/2023 12:28:55 PM This report has been signed electronically.

## 2023-06-25 NOTE — Progress Notes (Signed)
HISTORY OF PRESENT ILLNESS:  Jordan Wade is a 67 y.o. female with a personal history of adenomatous colon polyp and strong family history of colon cancer.  Now for surveillance colonoscopy  REVIEW OF SYSTEMS:  All non-GI ROS negative except for  Past Medical History:  Diagnosis Date   Carpal tunnel syndrome of right wrist    Family history of colon cancer-  mom age 97 04/17/2016   Goes every 5 years.  Last done 2019     Family history of mixed hyperlipidemia 04/17/2016   Family history of stroke or transient ischemic attack in mother 04/17/2016   H/O bilateral hip replacements 04/17/2016   Hyperlipidemia    diet controlled   Osteoarthritis    generalized   PONV (postoperative nausea and vomiting)    Trigger thumb of right hand     Past Surgical History:  Procedure Laterality Date   BILATERAL ANTERIOR TOTAL HIP ARTHROPLASTY Bilateral 11/04/2012   Procedure: BILATERAL ANTERIOR TOTAL HIP ARTHROPLASTY;  Surgeon: Kathryne Hitch, MD;  Location: [facility];  Service: Orthopedics;  Laterality: Bilateral;   CARPAL TUNNEL RELEASE Right 10/18/2018   Procedure: CARPAL TUNNEL RELEASE;  Surgeon: Cindee Salt, MD;  Location: [facility];  Service: Orthopedics;  Laterality: Right;  FAB   CESAREAN SECTION     x 2   JOINT REPLACEMENT  March 2014   Bilateral hip relacement   TRIGGER FINGER RELEASE Right 10/18/2018   Procedure: RELEASE TRIGGER FINGER/A-1 PULLEY;  Surgeon: Cindee Salt, MD;  Location: [facility];  Service: Orthopedics;  Laterality: Right;  FAB   TUBAL LIGATION      Social History Jordan Wade  reports that she has never smoked. She has never been exposed to tobacco smoke. She has never used smokeless tobacco. She reports that she does not drink alcohol and does not use drugs.  family history includes Arthritis in her mother; Breast cancer in her paternal aunt; Cancer in her father and mother; Colon cancer in her cousin; Colon cancer (age of  onset: 4) in her mother; Colon cancer (age of onset: 8) in her maternal grandfather; Colon polyps (age of onset: 26) in her mother; Colon polyps (age of onset: 49) in her maternal grandfather; Diabetes in her maternal grandfather; Heart attack in her maternal grandfather; Heart disease in her maternal grandfather; Hyperlipidemia in her mother; Hypertension in her father; Stroke in her mother.  No Known Allergies     PHYSICAL EXAMINATION: Vital signs: BP (!) 144/79   Pulse 75   Temp 97.8 F (36.6 C)   Ht 5\' 1"  (1.549 m)   Wt 136 lb (61.7 kg)   SpO2 98%   BMI 25.70 kg/m  General: Well-developed, well-nourished, no acute distress HEENT: Sclerae are anicteric, conjunctiva pink. Oral mucosa intact Lungs: Clear Heart: Regular Abdomen: soft, nontender, nondistended, no obvious ascites, no peritoneal signs, normal bowel sounds. No organomegaly. Extremities: No edema Psychiatric: alert and oriented x3. Cooperative     ASSESSMENT:  Family history of colon cancer Personal history of adenomatous colon polyp   PLAN:  Surveillance colonoscopy

## 2023-06-25 NOTE — Progress Notes (Signed)
Report to PACU, RN, vss, BBS= Clear.  

## 2023-06-25 NOTE — Progress Notes (Signed)
Pt's states no medical or surgical changes since previsit or office visit. 

## 2023-06-25 NOTE — Patient Instructions (Signed)
Discharge instructions given. Handouts on polyps and Diverticulosis. Resume previous medications. YOU HAD AN ENDOSCOPIC PROCEDURE TODAY AT THE [facility]:   Refer to the procedure report that was given to you for any specific questions about what was found during the examination.  If the procedure report does not answer your questions, please call your gastroenterologist to clarify.  If you requested that your care partner not be given the details of your procedure findings, then the procedure report has been included in a sealed envelope for you to review at your convenience later.  YOU SHOULD EXPECT: Some feelings of bloating in the abdomen. Passage of more gas than usual.  Walking can help get rid of the air that was put into your GI tract during the procedure and reduce the bloating. If you had a lower endoscopy (such as a colonoscopy or flexible sigmoidoscopy) you may notice spotting of blood in your stool or on the toilet paper. If you underwent a bowel prep for your procedure, you may not have a normal bowel movement for a few days.  Please Note:  You might notice some irritation and congestion in your nose or some drainage.  This is from the oxygen used during your procedure.  There is no need for concern and it should clear up in a day or so.  SYMPTOMS TO REPORT IMMEDIATELY:  Following lower endoscopy (colonoscopy or flexible sigmoidoscopy):  Excessive amounts of blood in the stool  Significant tenderness or worsening of abdominal pains  Swelling of the abdomen that is new, acute  Fever of 100F or higher  For urgent or emergent issues, a gastroenterologist can be reached at any hour by calling [phone]. Do not use MyChart messaging for urgent concerns.    DIET:  We do recommend a small meal at first, but then you may proceed to your regular diet.  Drink plenty of fluids but you should avoid alcoholic beverages for 24 hours.  ACTIVITY:  You should plan to take it  easy for the rest of today and you should NOT DRIVE or use heavy machinery until tomorrow (because of the sedation medicines used during the test).    FOLLOW UP: Our staff will call the number listed on your records the next business day following your procedure.  We will call around 7:15- 8:00 am to check on you and address any questions or concerns that you may have regarding the information given to you following your procedure. If we do not reach you, we will leave a message.     If any biopsies were taken you will be contacted by phone or by letter within the next 1-3 weeks.  Please call us at [phone] if you have not heard about the biopsies in 3 weeks.    SIGNATURES/CONFIDENTIALITY: You and/or your care partner have signed paperwork which will be entered into your electronic medical record.  These signatures attest to the fact that that the information above on your After Visit Summary has been reviewed and is understood.  Full responsibility of the confidentiality of this discharge information lies with you and/or your care-partner. 

## 2023-06-25 NOTE — Progress Notes (Signed)
Called to room to assist during endoscopic procedure.  Patient ID and intended procedure confirmed with present staff. Received instructions for my participation in the procedure from the performing physician.  

## 2023-06-28 ENCOUNTER — Telehealth: Payer: Self-pay | Admitting: *Deleted

## 2023-06-28 NOTE — Telephone Encounter (Signed)
  Follow up Call-     06/25/2023   11:19 AM  Call back number  Post procedure Call Back phone  # [phone]  Permission to leave phone message Yes     Patient questions:  Do you have a fever, pain , or abdominal swelling? No. Pain Score  0 *  Have you tolerated food without any problems? Yes.    Have you been able to return to your normal activities? Yes.    Do you have any questions about your discharge instructions: Diet   No. Medications  No. Follow up visit  No.  Do you have questions or concerns about your Care? No.  Actions: * If pain score is 4 or above: No action needed, pain <4.

## 2023-06-29 ENCOUNTER — Encounter: Payer: Self-pay | Admitting: Internal Medicine

## 2023-06-29 LAB — SURGICAL PATHOLOGY

## 2023-07-02 DIAGNOSIS — E785 Hyperlipidemia, unspecified: Secondary | ICD-10-CM | POA: Diagnosis not present

## 2023-07-02 DIAGNOSIS — R030 Elevated blood-pressure reading, without diagnosis of hypertension: Secondary | ICD-10-CM | POA: Diagnosis not present

## 2023-07-02 DIAGNOSIS — N182 Chronic kidney disease, stage 2 (mild): Secondary | ICD-10-CM | POA: Diagnosis not present

## 2023-07-02 DIAGNOSIS — Z7982 Long term (current) use of aspirin: Secondary | ICD-10-CM | POA: Diagnosis not present

## 2023-07-02 DIAGNOSIS — Z791 Long term (current) use of non-steroidal anti-inflammatories (NSAID): Secondary | ICD-10-CM | POA: Diagnosis not present

## 2023-07-02 DIAGNOSIS — M8580 Other specified disorders of bone density and structure, unspecified site: Secondary | ICD-10-CM | POA: Diagnosis not present

## 2023-07-02 DIAGNOSIS — M1990 Unspecified osteoarthritis, unspecified site: Secondary | ICD-10-CM | POA: Diagnosis not present

## 2023-07-02 DIAGNOSIS — Z96649 Presence of unspecified artificial hip joint: Secondary | ICD-10-CM | POA: Diagnosis not present

## 2023-08-18 ENCOUNTER — Other Ambulatory Visit: Payer: Self-pay | Admitting: Family Medicine

## 2023-08-18 DIAGNOSIS — Z1231 Encounter for screening mammogram for malignant neoplasm of breast: Secondary | ICD-10-CM

## 2023-08-31 ENCOUNTER — Ambulatory Visit
Admission: RE | Admit: 2023-08-31 | Discharge: 2023-08-31 | Disposition: A | Discharge status: Home / Self Care | Payer: Medicare PPO | Source: Ambulatory Visit

## 2023-08-31 DIAGNOSIS — Z1231 Encounter for screening mammogram for malignant neoplasm of breast: Secondary | ICD-10-CM | POA: Diagnosis not present

## 2023-09-01 ENCOUNTER — Encounter: Payer: Self-pay | Admitting: Family Medicine

## 2023-09-16 ENCOUNTER — Encounter: Payer: Self-pay | Admitting: Family Medicine

## 2023-09-23 DIAGNOSIS — D2261 Melanocytic nevi of right upper limb, including shoulder: Secondary | ICD-10-CM | POA: Diagnosis not present

## 2023-09-23 DIAGNOSIS — Z85828 Personal history of other malignant neoplasm of skin: Secondary | ICD-10-CM | POA: Diagnosis not present

## 2023-09-23 DIAGNOSIS — L821 Other seborrheic keratosis: Secondary | ICD-10-CM | POA: Diagnosis not present

## 2023-09-23 DIAGNOSIS — Z85820 Personal history of malignant melanoma of skin: Secondary | ICD-10-CM | POA: Diagnosis not present

## 2023-09-23 DIAGNOSIS — D2271 Melanocytic nevi of right lower limb, including hip: Secondary | ICD-10-CM | POA: Diagnosis not present

## 2023-09-23 DIAGNOSIS — Z808 Family history of malignant neoplasm of other organs or systems: Secondary | ICD-10-CM | POA: Diagnosis not present

## 2023-09-23 DIAGNOSIS — Z86018 Personal history of other benign neoplasm: Secondary | ICD-10-CM | POA: Diagnosis not present

## 2023-09-23 DIAGNOSIS — R238 Other skin changes: Secondary | ICD-10-CM | POA: Diagnosis not present

## 2023-09-23 DIAGNOSIS — L570 Actinic keratosis: Secondary | ICD-10-CM | POA: Diagnosis not present

## 2023-09-23 DIAGNOSIS — D225 Melanocytic nevi of trunk: Secondary | ICD-10-CM | POA: Diagnosis not present

## 2023-10-13 DIAGNOSIS — M1812 Unilateral primary osteoarthritis of first carpometacarpal joint, left hand: Secondary | ICD-10-CM | POA: Diagnosis not present

## 2023-10-13 DIAGNOSIS — G5602 Carpal tunnel syndrome, left upper limb: Secondary | ICD-10-CM | POA: Diagnosis not present

## 2023-10-13 DIAGNOSIS — M67431 Ganglion, right wrist: Secondary | ICD-10-CM | POA: Diagnosis not present

## 2023-11-02 DIAGNOSIS — M1812 Unilateral primary osteoarthritis of first carpometacarpal joint, left hand: Secondary | ICD-10-CM | POA: Diagnosis not present

## 2023-11-02 DIAGNOSIS — G5602 Carpal tunnel syndrome, left upper limb: Secondary | ICD-10-CM | POA: Diagnosis not present

## 2023-11-02 DIAGNOSIS — G8918 Other acute postprocedural pain: Secondary | ICD-10-CM | POA: Diagnosis not present

## 2023-11-02 HISTORY — PX: OTHER SURGICAL HISTORY: SHX169

## 2024-01-17 ENCOUNTER — Ambulatory Visit (INDEPENDENT_AMBULATORY_CARE_PROVIDER_SITE_OTHER)

## 2024-01-17 ENCOUNTER — Telehealth: Payer: Self-pay

## 2024-01-17 ENCOUNTER — Ambulatory Visit: Payer: Self-pay

## 2024-01-17 DIAGNOSIS — R350 Frequency of micturition: Secondary | ICD-10-CM | POA: Diagnosis not present

## 2024-01-17 LAB — POCT URINALYSIS DIPSTICK
Bilirubin, UA: NEGATIVE
Glucose, UA: NEGATIVE
Ketones, UA: NEGATIVE
Nitrite, UA: NEGATIVE
Protein, UA: NEGATIVE
Spec Grav, UA: 1.01 (ref 1.010–1.025)
Urobilinogen, UA: 0.2 U/dL
pH, UA: 6 (ref 5.0–8.0)

## 2024-01-17 MED ORDER — SULFAMETHOXAZOLE-TRIMETHOPRIM 800-160 MG PO TABS: 1.0000 | ORAL_TABLET | Freq: Two times a day (BID) | ORAL | 0 refills | Status: AC

## 2024-01-17 NOTE — Telephone Encounter (Signed)
 Called pt she is advised of her lab results and her recommendation

## 2024-01-17 NOTE — Telephone Encounter (Signed)
  FYI Only or Action Required?: Action required by provider  Patient was last seen in primary care on 06/24/2023. Called Nurse Triage reporting Hematuria. Symptoms began several days ago. Interventions attempted: Nothing. Symptoms are: stable.  Triage Disposition: No disposition on file. See provider within 24 hours Patient/caregiver understands and will follow disposition?: yes - going to UC  Pt needs information regarding last tetanus shot. Please respond via MyChart.       Copied from CRM [id number]. Topic: Clinical - Red Word Triage >> Jan 17, 2024  8:27 AM Juluis Ok wrote: [facility] that prompted transfer to Nurse Triage: blood in urine, frequent urination, burning with urination Reason for Disposition  Blood in urine  (Exception: Could be normal menstrual bleeding.)  Answer Assessment - Initial Assessment Questions 1. COLOR of URINE: "Describe the color of the urine."  (e.g., tea-colored, pink, red, bloody) "Do you have blood clots in your urine?" (e.g., none, pea, grape, small coin)     cloudy 2. ONSET: "When did the bleeding start?"      Friday 3. EPISODES: "How many times has there been blood in the urine?" or "How many times today?"     Friday and Saturday 4. PAIN with URINATION: "Is there any pain with passing your urine?" If Yes, ask: "How bad is the pain?"  (Scale 1-10; or mild, moderate, severe)    - MILD: Complains slightly about urination hurting.    - MODERATE: Interferes with normal activities.      - SEVERE: Excruciating, unwilling or unable to urinate because of the pain.      mild 5. FEVER: "Do you have a fever?" If Yes, ask: "What is your temperature, how was it measured, and when did it start?"     no 6. ASSOCIATED SYMPTOMS: "Are you passing urine more frequently than usual?"     Frequency - Burning with urination 7. OTHER SYMPTOMS: "Do you have any other symptoms?" (e.g., back/flank pain, abdomen pain, vomiting)     Cloudy urine  Protocols used: Urine -  Blood In-A-AH

## 2024-01-17 NOTE — Telephone Encounter (Signed)
 Can we call the patient and if she hasn't gone to the urgent care yet can you let her know she can come here if she wants to provide a urine sample for her symptoms.

## 2024-01-17 NOTE — Telephone Encounter (Signed)
-----   Message from Odilia Bennett sent at 01/17/2024  4:32 PM EDT ----- Regarding: UTI Pt came in today as nurse visit and left a urine sample for UTI symptoms. I have sent in Bactrim to take twice per day x 7 days to treat this. Can we call the patient and let her know that I've sent in an antibiotic to her pharmacy on file. If her symptoms worsen or do not improve with the medicine, she needs to schedule a follow up appointment. Thank you!

## 2024-01-17 NOTE — Telephone Encounter (Signed)
 Contacted patient and she will come in this afternoon to give a urine sample.

## 2024-01-17 NOTE — Progress Notes (Signed)
 Patient came for nurse visit to leave urine sample after complaints of UTI symptoms/frequency/blood in urine x3 days. Patient reports mild pain over the bladder. Denies fever or back/flank pain. Am sending in Bactrim 800-160 mg BID x 7 days for treatment of UTI. If symptoms worsen or do not improve, advised patient to call and schedule a follow up appointment.   Odilia Bennett, PA-C

## 2024-01-18 ENCOUNTER — Ambulatory Visit

## 2024-03-29 ENCOUNTER — Ambulatory Visit

## 2024-04-13 ENCOUNTER — Ambulatory Visit: Payer: Medicare PPO

## 2024-04-13 DIAGNOSIS — Z0000 Encounter for general adult medical examination without abnormal findings: Secondary | ICD-10-CM

## 2024-04-13 NOTE — Patient Instructions (Signed)
 Jordan Wade , Thank you for taking time out of your busy schedule to complete your Annual Wellness Visit with me. I enjoyed our conversation and look forward to speaking with you again next year. I, as well as your care team,  appreciate your ongoing commitment to your health goals. Please review the following plan we discussed and let me know if I can assist you in the future. Your Game plan/ To Do List    Referrals: If you haven't heard from the office you've been referred to, please reach out to them at the phone provided.   Follow up Visits: We will see or speak with you next year for your Next Medicare AWV with our clinical staff Have you seen your provider in the last 6 months (3 months if uncontrolled diabetes)? Yes  Clinician Recommendations:  Aim for 30 minutes of exercise or brisk walking, 6-8 glasses of water , and 5 servings of fruits and vegetables each day.       This is a list of the screenings recommended for you:  Health Maintenance  Topic Date Due   COVID-19 Vaccine (3 - Pfizer risk series) 11/30/2019   Pneumococcal Vaccine for age over 23 (2 of 2 - PCV20 or PCV21) 06/15/2021   DEXA scan (bone density measurement)  01/03/2024   Flu Shot  03/10/2024   Medicare Annual Wellness Visit  04/13/2025   DTaP/Tdap/Td vaccine (2 - Td or Tdap) 08/13/2025   Mammogram  08/30/2025   Colon Cancer Screening  06/24/2028   Hepatitis C Screening  Completed   Zoster (Shingles) Vaccine  Completed   HPV Vaccine  Aged Out   Meningitis B Vaccine  Aged Out   Hepatitis B Vaccine  Discontinued    Advanced directives: (Copy Requested) Please bring a copy of your health care power of attorney and living will to the office to be added to your chart at your convenience. You can mail to [facility] [address] [place], [place] [address] or email to [email or web address] Advance Care Planning is important because it:  [x]  Makes sure you receive the medical care that is  consistent with your values, goals, and preferences  [x]  It provides guidance to your family and loved ones and reduces their decisional burden about whether or not they are making the right decisions based on your wishes.  Follow the link provided in your after visit summary or read over the paperwork we have mailed to you to help you started getting your Advance Directives in place. If you need assistance in completing these, please reach out to us  so that we can help you!  See attachments for Preventive Care and Fall Prevention Tips.

## 2024-04-13 NOTE — Progress Notes (Signed)
 Subjective:   Jordan Wade is a 68 y.o. who presents for a Medicare Wellness preventive visit.  As a reminder, Annual Wellness Visits don't include a physical exam, and some assessments may be limited, especially if this visit is performed virtually. We may recommend an in-person follow-up visit with your provider if needed.  Visit Complete: Virtual I connected with  Jordan Wade on 04/13/24 by a audio enabled telemedicine application and verified that I am speaking with the correct person using two identifiers.  Patient Location: Home  Provider Location: Home Office  I discussed the limitations of evaluation and management by telemedicine. The patient expressed understanding and agreed to proceed.  Vital Signs: Because this visit was a virtual/telehealth visit, some criteria may be missing or patient reported. Any vitals not documented were not able to be obtained and vitals that have been documented are patient reported.  VideoError- Librarian, academic were attempted between this provider and patient, however failed, due to patient having technical difficulties OR patient did not have access to video capability.  We continued and completed visit with audio only.   Persons Participating in Visit: Patient.  AWV Questionnaire: Yes: Patient Medicare AWV questionnaire was completed by the patient on 04/11/2024; I have confirmed that all information answered by patient is correct and no changes since this date.  Cardiac Risk Factors include: advanced age (>47men, >25 women)     Objective:    Today's Vitals   There is no height or weight on file to calculate BMI.     04/13/2024    9:36 AM 04/15/2023    4:06 AM 04/06/2023    9:18 AM 10/18/2018    9:06 AM 10/10/2018   11:27 AM 02/18/2017    9:31 AM 04/15/2016    3:02 PM  Advanced Directives  Does Patient Have a Medical Advance Directive? Yes No Yes Yes  Yes  Yes  Yes   Type of Engineer, mining of [place];Living will  Healthcare Power of [place];Living will Healthcare Power of [place];Living will Healthcare Power of [place];Living will Healthcare Power of [place];Living will Healthcare Power of Attorney   Does patient want to make changes to medical advance directive?    No - Patient declined  No - Patient declined     Copy of Healthcare Power of Attorney in Chart? No - copy requested  No - copy requested No - copy requested         Data saved with a previous flowsheet row definition    Current Medications (verified) Outpatient Encounter Medications as of 04/13/2024  Medication Sig   aspirin EC 81 MG tablet Take 81 mg by mouth daily. Swallow whole.   Calcium Carb-Cholecalciferol [id number] MG-UNIT CAPS Take 1 capsule by mouth 2 (two) times daily.    COENZYME Q10 PO Take 1 tablet by mouth daily.   Omega-3 Fatty Acids (FISH OIL  PO) Take 1 tablet by mouth 3 (three) times a week.   Facility-Administered Encounter Medications as of 04/13/2024  Medication   0.9 %  sodium chloride  infusion    Allergies (verified) Patient has no known allergies.   History: Past Medical History:  Diagnosis Date   Carpal tunnel syndrome of right wrist    Family history of colon cancer-  mom age 23 04/17/2016   Goes every 5 years.  Last done 2019     Family history of mixed hyperlipidemia 04/17/2016   Family history of stroke or transient ischemic attack in mother 04/17/2016  H/O bilateral hip replacements 04/17/2016   Hyperlipidemia    diet controlled   Osteoarthritis    generalized   PONV (postoperative nausea and vomiting)    Trigger thumb of right hand    Past Surgical History:  Procedure Laterality Date   BILATERAL ANTERIOR TOTAL HIP ARTHROPLASTY Bilateral 11/04/2012   Procedure: BILATERAL ANTERIOR TOTAL HIP ARTHROPLASTY;  Surgeon: Lonni CINDERELLA Poli, MD;  Location: [facility];  Service: Orthopedics;  Laterality: Bilateral;   CARPAL TUNNEL RELEASE Right 10/18/2018   Procedure:  CARPAL TUNNEL RELEASE;  Surgeon: Murrell Kuba, MD;  Location: [facility];  Service: Orthopedics;  Laterality: Right;  FAB   CESAREAN SECTION     x 2   JOINT REPLACEMENT  March 2014   Bilateral hip relacement   MENISCUS REPAIR Right    03/2023   thumb joint  Left 11/02/2023   TRIGGER FINGER RELEASE Right 10/18/2018   Procedure: RELEASE TRIGGER FINGER/A-1 PULLEY;  Surgeon: Murrell Kuba, MD;  Location: [facility];  Service: Orthopedics;  Laterality: Right;  FAB   TUBAL LIGATION     Family History  Problem Relation Age of Onset   Colon polyps Mother 80   Hyperlipidemia Mother    Stroke Mother    Colon cancer Mother 51   Arthritis Mother    Cancer Mother    Hypertension Father    Cancer Father    Breast cancer Paternal Aunt        apprx. 45   Colon polyps Maternal Grandfather 61   Diabetes Maternal Grandfather    Colon cancer Maternal Grandfather 61   Heart attack Maternal Grandfather    Heart disease Maternal Grandfather    Colon cancer Cousin        and paternal greatgrandfather   Esophageal cancer Neg Hx    Rectal cancer Neg Hx    Stomach cancer Neg Hx    Social History   Socioeconomic History   Marital status: Married    Spouse name: Not on file   Number of children: 2   Years of education: Not on file   Highest education level: Master's degree (e.g., MA, MS, MEng, MEd, MSW, MBA)  Occupational History   Occupation: retired Runner, broadcasting/film/video  Tobacco Use   Smoking status: Never    Passive exposure: Never   Smokeless tobacco: Never  Vaping Use   Vaping status: Never Used  Substance and Sexual Activity   Alcohol use: No   Drug use: No   Sexual activity: Not Currently    Birth control/protection: None  Other Topics Concern   Not on file  Social History Narrative   Not on file   Social Drivers of Health   Financial Resource Strain: Low Risk  (04/11/2024)   Overall Financial Resource Strain (CARDIA)    Difficulty of Paying Living Expenses: Not  hard at all  Food Insecurity: No Food Insecurity (04/11/2024)   Hunger Vital Sign    Worried About Running Out of Food in the Last Year: Never true    Ran Out of Food in the Last Year: Never true  Transportation Needs: No Transportation Needs (04/11/2024)   PRAPARE - Administrator, Civil Service (Medical): No    Lack of Transportation (Non-Medical): No  Physical Activity: Sufficiently Active (04/11/2024)   Exercise Vital Sign    Days of Exercise per Week: 5 days    Minutes of Exercise per Session: 50 min  Stress: No Stress Concern Present (04/11/2024)   [employer]  of Occupational Health - Occupational Stress Questionnaire    Feeling of Stress: Only a little  Social Connections: Socially Integrated (04/11/2024)   Social Connection and Isolation Panel    Frequency of Communication with Friends and Family: More than three times a week    Frequency of Social Gatherings with Friends and Family: More than three times a week    Attends Religious Services: More than 4 times per year    Active Member of [employer] or Organizations: Yes    Attends Engineer, structural: More than 4 times per year    Marital Status: Married    Tobacco Counseling Counseling given: Not Answered    Clinical Intake:  Pre-visit preparation completed: Yes  Pain : No/denies pain     Nutritional Risks: None Diabetes: No  Lab Results  Component Value Date   HGBA1C 5.9 (H) 06/24/2023   HGBA1C 6.5 (H) 12/22/2022   HGBA1C 6.1 (H) 06/12/2022     How often do you need to have someone help you when you read instructions, pamphlets, or other written materials from your doctor or pharmacy?: 1 - Never  Interpreter Needed?: No  Information entered by :: NAllen LPN   Activities of Daily Living     04/11/2024    8:36 AM  In your present state of health, do you have any difficulty performing the following activities:  Hearing? 0  Vision? 0  Difficulty concentrating or making decisions? 0   Walking or climbing stairs? 0  Dressing or bathing? 0  Doing errands, shopping? 0  Preparing Food and eating ? N  Using the Toilet? N  In the past six months, have you accidently leaked urine? N  Do you have problems with loss of bowel control? N  Managing your Medications? N  Managing your Finances? N  Housekeeping or managing your Housekeeping? N    Patient Care Team: Gayle Saddie JULIANNA DEVONNA as PCP - General (Physician Assistant) Vernetta Lonni GRADE, MD as Consulting Physician (Orthopedic Surgery) Ishmael Slough, MD as Consulting Physician (Rheumatology) Robinson Pao, MD as Consulting Physician (Dermatology) [facility]  I have updated your Care Teams any recent Medical Services you may have received from other providers in the past year.     Assessment:   This is a routine wellness examination for [facility].  Hearing/Vision screen Hearing Screening - Comments:: Denies hearing issues Vision Screening - Comments:: Regular eye exams, [facility]   Goals Addressed             This Visit's Progress    Patient Stated       04/13/2024, maintain weight, stay off medication and stay active       Depression Screen     04/13/2024    9:37 AM 06/24/2023    8:18 AM 04/06/2023    9:16 AM 06/23/2022    2:27 PM 07/30/2021   10:23 AM 06/11/2021    8:52 AM 05/09/2021   10:30 AM  PHQ 2/9 Scores  PHQ - 2 Score 0 0 0 0 0 0 0  PHQ- 9 Score 0 0  1 0 1 0    Fall Risk     04/11/2024    8:36 AM 06/24/2023    8:16 AM 04/06/2023    9:17 AM 06/23/2022    2:27 PM 07/30/2021   10:24 AM  Fall Risk   Falls in the past year? 0 1 0 0 0  Number falls in past yr: 0 0 0 0 0  Injury with Fall? 0 1 0 0 0  Risk for fall due to : No Fall Risks History of fall(s) No Fall Risks    Follow up Falls prevention discussed;Falls evaluation completed Falls evaluation completed Falls prevention discussed  Falls evaluation completed      Data saved with a previous flowsheet row  definition    MEDICARE RISK AT HOME:  Medicare Risk at Home Any stairs in or around the home?: (Patient-Rptd) Yes If so, are there any without handrails?: (Patient-Rptd) No Home free of loose throw rugs in walkways, pet beds, electrical cords, etc?: (Patient-Rptd) Yes Adequate lighting in your home to reduce risk of falls?: (Patient-Rptd) Yes Life alert?: (Patient-Rptd) No Use of a cane, walker or w/c?: (Patient-Rptd) No Grab bars in the bathroom?: (Patient-Rptd) No Shower chair or bench in shower?: (Patient-Rptd) No Elevated toilet seat or a handicapped toilet?: (Patient-Rptd) Yes  TIMED UP AND GO:  Was the test performed?  No  Cognitive Function: 6CIT completed        04/13/2024    9:37 AM 04/06/2023    9:18 AM 05/09/2021   10:28 AM  6CIT Screen  What Year? 0 points 0 points 0 points  What month? 0 points 0 points 0 points  What time? 0 points 0 points 0 points  Count back from 20 0 points 0 points 0 points  Months in reverse 0 points 0 points 0 points  Repeat phrase 0 points 0 points 0 points  Total Score 0 points 0 points 0 points    Immunizations Immunization History  Administered Date(s) Administered   Fluad Quad(high Dose 65+) 06/11/2021   Hepatitis A, Adult 09/19/1998, 07/29/1999   Hepatitis B, ADULT 12/20/2006, 01/21/2007, 08/12/2007   IPV 12/10/1999   Influenza-Unspecified 04/10/2016, 04/10/2017, 05/10/2018   PFIZER(Purple Top)SARS-COV-2 Vaccination 10/12/2019, 11/02/2019   Pneumococcal Conjugate-13 06/15/2020   Tdap 08/14/2015   Typhoid Live 12/30/2004   Yellow Fever 02/17/2006   Zoster Recombinant(Shingrix ) 04/12/2019, 06/12/2019   Zoster, Live 08/11/2015    Screening Tests Health Maintenance  Topic Date Due   COVID-19 Vaccine (3 - Pfizer risk series) 11/30/2019   Pneumococcal Vaccine: 50+ Years (2 of 2 - PCV20 or PCV21) 06/15/2021   DEXA SCAN  01/03/2024   INFLUENZA VACCINE  03/10/2024   Medicare Annual Wellness (AWV)  04/13/2025   DTaP/Tdap/Td  (2 - Td or Tdap) 08/13/2025   MAMMOGRAM  08/30/2025   Colonoscopy  06/24/2028   Hepatitis C Screening  Completed   Zoster Vaccines- Shingrix   Completed   HPV VACCINES  Aged Out   Meningococcal B Vaccine  Aged Out   Hepatitis B Vaccines 19-59 Average Risk  Discontinued    Health Maintenance  Health Maintenance Due  Topic Date Due   COVID-19 Vaccine (3 - Pfizer risk series) 11/30/2019   Pneumococcal Vaccine: 50+ Years (2 of 2 - PCV20 or PCV21) 06/15/2021   DEXA SCAN  01/03/2024   INFLUENZA VACCINE  03/10/2024   Health Maintenance Items Addressed: Declines flu and covid vaccine. Due pneumonia vaccine. May have bone density this month.  Additional Screening:  Vision Screening: Recommended annual ophthalmology exams for early detection of glaucoma and other disorders of the eye. Would you like a referral to an eye doctor? No    Dental Screening: Recommended annual dental exams for proper oral hygiene  Community Resource Referral / Chronic Care Management: CRR required this visit?  No   CCM required this visit?  No   Plan:    I have personally reviewed  and noted the following in the patient's chart:   Medical and social history Use of alcohol, tobacco or illicit drugs  Current medications and supplements including opioid prescriptions. Patient is not currently taking opioid prescriptions. Functional ability and status Nutritional status Physical activity Advanced directives List of other physicians Hospitalizations, surgeries, and ER visits in previous 12 months Vitals Screenings to include cognitive, depression, and falls Referrals and appointments  In addition, I have reviewed and discussed with patient certain preventive protocols, quality metrics, and best practice recommendations. A written personalized care plan for preventive services as well as general preventive health recommendations were provided to patient.   Ardella FORBES Dawn, LPN   [id number]/7972   After  Visit Summary: (MyChart) Due to this being a telephonic visit, the after visit summary with patients personalized plan was offered to patient via MyChart   Notes: Nothing significant to report at this time.

## 2024-06-12 ENCOUNTER — Other Ambulatory Visit: Payer: Self-pay

## 2024-06-12 DIAGNOSIS — R7303 Prediabetes: Secondary | ICD-10-CM

## 2024-06-12 DIAGNOSIS — Z130 Encounter for screening for diseases of the blood and blood-forming organs and certain disorders involving the immune mechanism: Secondary | ICD-10-CM

## 2024-06-16 ENCOUNTER — Other Ambulatory Visit: Payer: Medicare PPO

## 2024-06-16 DIAGNOSIS — Z130 Encounter for screening for diseases of the blood and blood-forming organs and certain disorders involving the immune mechanism: Secondary | ICD-10-CM | POA: Diagnosis not present

## 2024-06-16 DIAGNOSIS — R7303 Prediabetes: Secondary | ICD-10-CM

## 2024-06-16 DIAGNOSIS — Z1329 Encounter for screening for other suspected endocrine disorder: Secondary | ICD-10-CM | POA: Diagnosis not present

## 2024-06-16 DIAGNOSIS — Z13228 Encounter for screening for other metabolic disorders: Secondary | ICD-10-CM | POA: Diagnosis not present

## 2024-06-16 DIAGNOSIS — Z1321 Encounter for screening for nutritional disorder: Secondary | ICD-10-CM | POA: Diagnosis not present

## 2024-06-17 LAB — CBC WITH DIFFERENTIAL/PLATELET
Basophils Absolute: 0 x10E3/uL (ref 0.0–0.2)
Basos: 1 %
EOS (ABSOLUTE): 0.2 x10E3/uL (ref 0.0–0.4)
Eos: 2 %
Hematocrit: 42.3 % (ref 34.0–46.6)
Hemoglobin: 13.9 g/dL (ref 11.1–15.9)
Immature Grans (Abs): 0 x10E3/uL (ref 0.0–0.1)
Immature Granulocytes: 0 %
Lymphocytes Absolute: 2 x10E3/uL (ref 0.7–3.1)
Lymphs: 27 %
MCH: 30.8 pg (ref 26.6–33.0)
MCHC: 32.9 g/dL (ref 31.5–35.7)
MCV: 94 fL (ref 79–97)
Monocytes Absolute: 0.7 x10E3/uL (ref 0.1–0.9)
Monocytes: 9 %
Neutrophils Absolute: 4.5 x10E3/uL (ref 1.4–7.0)
Neutrophils: 61 %
Platelets: 216 x10E3/uL (ref 150–450)
RBC: 4.52 x10E6/uL (ref 3.77–5.28)
RDW: 12.2 % (ref 11.7–15.4)
WBC: 7.5 x10E3/uL (ref 3.4–10.8)

## 2024-06-17 LAB — COMPREHENSIVE METABOLIC PANEL WITH GFR
ALT: 13 IU/L (ref 0–32)
AST: 18 IU/L (ref 0–40)
Albumin: 4.7 g/dL (ref 3.9–4.9)
Alkaline Phosphatase: 75 IU/L (ref 49–135)
BUN/Creatinine Ratio: 20 (ref 12–28)
BUN: 17 mg/dL (ref 8–27)
Bilirubin Total: 0.5 mg/dL (ref 0.0–1.2)
CO2: 24 mmol/L (ref 20–29)
Calcium: 9.8 mg/dL (ref 8.7–10.3)
Chloride: 100 mmol/L (ref 96–106)
Creatinine, Ser: 0.84 mg/dL (ref 0.57–1.00)
Globulin, Total: 2.8 g/dL (ref 1.5–4.5)
Glucose: 93 mg/dL (ref 70–99)
Potassium: 4.4 mmol/L (ref 3.5–5.2)
Sodium: 139 mmol/L (ref 134–144)
Total Protein: 7.5 g/dL (ref 6.0–8.5)
eGFR: 75 mL/min/1.73 (ref >59)

## 2024-06-17 LAB — LIPID PANEL
Chol/HDL Ratio: 3.6 ratio (ref 0.0–4.4)
Cholesterol, Total: 254 mg/dL — ABNORMAL HIGH (ref 100–199)
HDL: 70 mg/dL (ref >39)
LDL Chol Calc (NIH): 169 mg/dL — ABNORMAL HIGH (ref 0–99)
Triglycerides: 87 mg/dL (ref 0–149)
VLDL Cholesterol Cal: 15 mg/dL (ref 5–40)

## 2024-06-17 LAB — VITAMIN D 25 HYDROXY (VIT D DEFICIENCY, FRACTURES): Vit D, 25-Hydroxy: 50.3 ng/mL (ref 30.0–100.0)

## 2024-06-17 LAB — HEMOGLOBIN A1C
Est. average glucose Bld gHb Est-mCnc: 128 mg/dL
Hgb A1c MFr Bld: 6.1 % — ABNORMAL HIGH (ref 4.8–5.6)

## 2024-06-17 LAB — TSH: TSH: 2.07 u[IU]/mL (ref 0.450–4.500)

## 2024-06-19 ENCOUNTER — Ambulatory Visit: Payer: Self-pay

## 2024-06-23 ENCOUNTER — Encounter: Payer: Self-pay | Admitting: Family Medicine

## 2024-06-23 ENCOUNTER — Encounter: Payer: Medicare PPO | Admitting: Family Medicine

## 2024-06-23 ENCOUNTER — Ambulatory Visit (INDEPENDENT_AMBULATORY_CARE_PROVIDER_SITE_OTHER): Admitting: Family Medicine

## 2024-06-23 VITALS — BP 127/84 | HR 70 | Ht 61.0 in | Wt 136.4 lb

## 2024-06-23 DIAGNOSIS — M159 Polyosteoarthritis, unspecified: Secondary | ICD-10-CM | POA: Diagnosis not present

## 2024-06-23 DIAGNOSIS — E7800 Pure hypercholesterolemia, unspecified: Secondary | ICD-10-CM

## 2024-06-23 DIAGNOSIS — Z23 Encounter for immunization: Secondary | ICD-10-CM

## 2024-06-23 DIAGNOSIS — Z0000 Encounter for general adult medical examination without abnormal findings: Secondary | ICD-10-CM | POA: Insufficient documentation

## 2024-06-23 DIAGNOSIS — M8580 Other specified disorders of bone density and structure, unspecified site: Secondary | ICD-10-CM | POA: Insufficient documentation

## 2024-06-23 DIAGNOSIS — R7303 Prediabetes: Secondary | ICD-10-CM

## 2024-06-23 NOTE — Assessment & Plan Note (Signed)
-   Reports multiple joint issues and pain. Inquired about magnesium. Counseled that magnesium is not typically used for joint pain but is safe to try, with the most common side effect being diarrhea. Recommended magnesium oxide if she chooses to try it. - May trial OTC magnesium oxide as directed.

## 2024-06-23 NOTE — Assessment & Plan Note (Signed)
-   History of osteopenia. Last DEXA scan in 2023 showed a T-score of -1.3. A screening done via Life Screen recently at the ankle also showed osteopenia. Takes calcium and vitamin D . - Order DEXA scan. - Continue calcium and vitamin D  supplementation. - Continue weight-bearing exercise.

## 2024-06-23 NOTE — Assessment & Plan Note (Addendum)
-   Elevated LDL of 169 on recent labs. Strong family history of premature cardiovascular disease. Discussed dietary modifications including limiting red meats, high-fat dairy, and processed foods with high saturated fat content (aiming for <5g/day). Patient is exercising regularly. Patient is very reluctant to start a statin due to concerns for joint pain side effects. Discussed alternative testing to further risk stratify, including ApoB, Lp(a), and coronary artery calcium score. Discussed alternative treatment options including Repatha. Discussed supplements. Advised that omega-3 fish oils can slightly increase LDL. Advised red yeast rice can cause muscle aches. Advised soluble fiber (Metamucil) is beneficial. - consider additional testing for ASCVD risk:  ApoB and Lp(a), Coronary Artery Calcium Score.

## 2024-06-23 NOTE — Assessment & Plan Note (Signed)
-   Up to date on mammogram (9 months ago, negative) and colonoscopy (last year, on a 5-year screening interval). Tetanus is up to date (last given in 2017). - Administered Prevnar 20 vaccine today. - Schedule for annual wellness visit in 1 year.

## 2024-06-23 NOTE — Patient Instructions (Signed)
 It was nice to see you today,  We addressed the following topics today: - the additional blood work for evaluating heart attack and stroke risk are (ApoB, Lp(a)) and the CT scan I was talking about is called a coronary artery calcium score  - I have also ordered a formal DEXA (bone density) scan. The imaging center will call you to schedule this. - coq-10, red yeast rice, soluble fiber (metamucil), and alpha lipoic acid are all over the counter options that may help your cholesterol levels.  - Continue your efforts with diet and exercise for cholesterol and blood sugar management. - You can try an over-the-counter magnesium supplement for your joint pain if you would like. Be aware it can cause diarrhea. Do not take more than the recommended dose on the bottle. - You received the pneumococcal vaccine (Prevnar 20) in the office today. - Please schedule a follow-up appointment for your annual exam on your way out.  Have a great day,  Rolan Slain, MD

## 2024-06-23 NOTE — Progress Notes (Signed)
 Annual physical  Subjective    Patient ID: BLAKE GOYA, female    DOB: 02/24/1956  Age: 68 y.o. MRN: [id number]  Chief Complaint  Patient presents with   Annual Exam   HPI Nijah is a 68 y.o. old female here  for annual exam.   Subjective - Follow-up to review recent lab results. - Concern for elevated A1C and cholesterol. - Reports attempts at dietary modification to lower A1C, including limiting carbohydrates. Denies eating a lot of rice, bread, or sugary drinks. Drinks water  and coffee with cream. - Reports limiting red meat, bacon, and sausage for cholesterol management. Acknowledges rare consumption of sausage recently. Aware of the need to monitor saturated fat in processed foods. - Reports exercising 40-60 minutes most days of the week. - Discussed statin therapy, reports being very against it due to concerns for muscle/joint side effects. - Inquires about magnesium for joint pain. - Inquires about current supplements: CoQ10, omega-3, calcium, vitamin D .  Medications Takes over-the-counter supplements only, including CoQ10, omega-3 fatty acids, calcium, and vitamin D .  PMH, PSH, FH, Social Hx PMHx: Hyperlipidemia, prediabetes (elevated A1C), osteopenia. History of urinary tract infection. PSHx: Bilateral total hip replacements, repaired meniscus, thumb joint replacement. FH: Strong family history of heart disease on mother's side, with multiple family members having heart attacks or open-heart surgery, typically in their 33s. Grandfather died of a massive MI at age 41. Mother is 86 and healthy. Mother and maternal grandfather had colon cancer. Social Hx: Denies tobacco use. Denies alcohol use. Is traveling to [place] for a 9-day mission trip in two weeks. Has seen a travel clinic for appropriate vaccinations.  ROS Constitutional: Denies chest pain or shortness of breath with exertion. Reports joint aches. Musculoskeletal: Reports multiple joint issues. Denies  muscle aches but has significant joint pain.    The 10-year ASCVD risk score (Arnett DK, et al., 2019) is: 9.6%  Health Maintenance Due  Topic Date Due   COVID-19 Vaccine (3 - Pfizer risk series) 11/30/2019   DEXA SCAN  01/03/2024      Objective:     BP 127/84   Pulse 70   Ht 5' 1 (1.549 m)   Wt 136 lb 6.4 oz (61.9 kg)   SpO2 98%   BMI 25.77 kg/m    Physical Exam Gen: alert, oriented HEENT: perrla, eomi, mmm CV: rrr, no murmur Pulm: lctab. No wheeze or crackles.  GI: soft, nbs.  Nontender to palpation MSK: strength equal b/l. Normal gait Ext: no pedal edema Skin: warm and dry, no rashes Psych: pleasant affect.  Spontaneous speech   No results found for any visits on 06/23/24.      Assessment & Plan:   Physical exam, annual  Elevated LDL cholesterol level Assessment & Plan: - Elevated LDL of 169 on recent labs. Strong family history of premature cardiovascular disease. Discussed dietary modifications including limiting red meats, high-fat dairy, and processed foods with high saturated fat content (aiming for <5g/day). Patient is exercising regularly. Patient is very reluctant to start a statin due to concerns for joint pain side effects. Discussed alternative testing to further risk stratify, including ApoB, Lp(a), and coronary artery calcium score. Discussed alternative treatment options including Repatha. Discussed supplements. Advised that omega-3 fish oils can slightly increase LDL. Advised red yeast rice can cause muscle aches. Advised soluble fiber (Metamucil) is beneficial. - consider additional testing for ASCVD risk:  ApoB and Lp(a), Coronary Artery Calcium Score.   Prediabetes Assessment & Plan: - Elevated  A1C on recent labs. Patient is working on dietary modifications, including limiting carbohydrates, and is exercising regularly. - Continue diet and exercise.    Osteopenia, unspecified location Assessment & Plan: - History of osteopenia. Last  DEXA scan in 2023 showed a T-score of -1.3. A screening done via Life Screen recently at the ankle also showed osteopenia. Takes calcium and vitamin D . - Order DEXA scan. - Continue calcium and vitamin D  supplementation. - Continue weight-bearing exercise.   Healthcare maintenance Assessment & Plan: - Up to date on mammogram (9 months ago, negative) and colonoscopy (last year, on a 5-year screening interval). Tetanus is up to date (last given in 2017). - Administered Prevnar 20 vaccine today. - Schedule for annual wellness visit in 1 year.   Generalized OA Assessment & Plan: - Reports multiple joint issues and pain. Inquired about magnesium. Counseled that magnesium is not typically used for joint pain but is safe to try, with the most common side effect being diarrhea. Recommended magnesium oxide if she chooses to try it. - May trial OTC magnesium oxide as directed.   Immunization due -     Pneumococcal conjugate vaccine 20-valent     Return in about 1 year (around 06/23/2025) for physical.    Toribio MARLA Slain, MD

## 2024-06-23 NOTE — Assessment & Plan Note (Addendum)
-   Elevated A1C on recent labs. Patient is working on dietary modifications, including limiting carbohydrates, and is exercising regularly. - Continue diet and exercise.

## 2025-06-19 ENCOUNTER — Other Ambulatory Visit

## 2025-06-21 ENCOUNTER — Ambulatory Visit

## 2025-06-26 ENCOUNTER — Encounter
